# Patient Record
Sex: Male | Born: 1940
Health system: Southern US, Community
[De-identification: ages and names within clinical notes are randomized; demographics above are authoritative.]

## PROBLEM LIST (undated history)

## (undated) DIAGNOSIS — L719 Rosacea, unspecified: Secondary | ICD-10-CM

## (undated) DIAGNOSIS — N189 Chronic kidney disease, unspecified: Secondary | ICD-10-CM

## (undated) DIAGNOSIS — Z86718 Personal history of other venous thrombosis and embolism: Secondary | ICD-10-CM

## (undated) DIAGNOSIS — K429 Umbilical hernia without obstruction or gangrene: Secondary | ICD-10-CM

## (undated) DIAGNOSIS — I10 Essential (primary) hypertension: Secondary | ICD-10-CM

## (undated) DIAGNOSIS — I739 Peripheral vascular disease, unspecified: Secondary | ICD-10-CM

## (undated) DIAGNOSIS — K219 Gastro-esophageal reflux disease without esophagitis: Secondary | ICD-10-CM

## (undated) DIAGNOSIS — E039 Hypothyroidism, unspecified: Secondary | ICD-10-CM

## (undated) DIAGNOSIS — N200 Calculus of kidney: Secondary | ICD-10-CM

## (undated) DIAGNOSIS — G4733 Obstructive sleep apnea (adult) (pediatric): Secondary | ICD-10-CM

## (undated) DIAGNOSIS — E785 Hyperlipidemia, unspecified: Secondary | ICD-10-CM

## (undated) DIAGNOSIS — M1990 Unspecified osteoarthritis, unspecified site: Secondary | ICD-10-CM

## (undated) DIAGNOSIS — E538 Deficiency of other specified B group vitamins: Secondary | ICD-10-CM

## (undated) DIAGNOSIS — N529 Male erectile dysfunction, unspecified: Secondary | ICD-10-CM

## (undated) DIAGNOSIS — E119 Type 2 diabetes mellitus without complications: Secondary | ICD-10-CM

## (undated) HISTORY — PX: OTHER SURGICAL HISTORY: SHX169

## (undated) HISTORY — DX: Male erectile dysfunction, unspecified: N52.9

## (undated) HISTORY — DX: Obstructive sleep apnea (adult) (pediatric): G47.33

## (undated) HISTORY — DX: Calculus of kidney: N20.0

## (undated) HISTORY — DX: Deficiency of other specified B group vitamins: E53.8

## (undated) HISTORY — DX: Hypothyroidism, unspecified: E03.9

## (undated) HISTORY — DX: Umbilical hernia without obstruction or gangrene: K42.9

## (undated) HISTORY — DX: Hyperlipidemia, unspecified: E78.5

## (undated) HISTORY — PX: HERNIA REPAIR: SHX51

## (undated) HISTORY — DX: Type 2 diabetes mellitus without complications: E11.9

## (undated) HISTORY — PX: APPENDECTOMY: SHX54

## (undated) HISTORY — DX: Rosacea, unspecified: L71.9

## (undated) HISTORY — PX: LITHOTRIPSY: SUR834

---
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
## 2009-07-31 LAB — HM COLONOSCOPY: HM Colonoscopy: NORMAL

## 2010-01-23 LAB — HM DEXA SCAN: HM Dexa Scan: NORMAL

## 2011-06-05 ENCOUNTER — Encounter (HOSPITAL_COMMUNITY): Payer: Self-pay | Admitting: Pharmacy Technician

## 2011-06-19 ENCOUNTER — Encounter (HOSPITAL_COMMUNITY): Payer: Self-pay

## 2011-06-19 ENCOUNTER — Ambulatory Visit (HOSPITAL_COMMUNITY)
Admission: RE | Admit: 2011-06-19 | Discharge: 2011-06-19 | Disposition: A | Discharge status: Home / Self Care | Payer: Medicare Other | Source: Ambulatory Visit | Attending: Orthopedic Surgery | Admitting: Orthopedic Surgery

## 2011-06-19 ENCOUNTER — Encounter (HOSPITAL_COMMUNITY)
Admission: RE | Admit: 2011-06-19 | Discharge: 2011-06-19 | Disposition: A | Discharge status: Home / Self Care | Payer: Medicare Other | Source: Ambulatory Visit | Attending: Orthopedic Surgery | Admitting: Orthopedic Surgery

## 2011-06-19 DIAGNOSIS — Z01818 Encounter for other preprocedural examination: Secondary | ICD-10-CM | POA: Insufficient documentation

## 2011-06-19 DIAGNOSIS — I10 Essential (primary) hypertension: Secondary | ICD-10-CM | POA: Insufficient documentation

## 2011-06-19 DIAGNOSIS — Z01812 Encounter for preprocedural laboratory examination: Secondary | ICD-10-CM | POA: Insufficient documentation

## 2011-06-19 DIAGNOSIS — Z87891 Personal history of nicotine dependence: Secondary | ICD-10-CM | POA: Insufficient documentation

## 2011-06-19 DIAGNOSIS — M5380 Other specified dorsopathies, site unspecified: Secondary | ICD-10-CM | POA: Insufficient documentation

## 2011-06-19 HISTORY — DX: Unspecified osteoarthritis, unspecified site: M19.90

## 2011-06-19 HISTORY — DX: Chronic kidney disease, unspecified: N18.9

## 2011-06-19 HISTORY — DX: Gastro-esophageal reflux disease without esophagitis: K21.9

## 2011-06-19 HISTORY — DX: Essential (primary) hypertension: I10

## 2011-06-19 LAB — URINALYSIS, ROUTINE W REFLEX MICROSCOPIC
Bilirubin Urine: NEGATIVE
Leukocytes, UA: NEGATIVE
Nitrite: NEGATIVE
Specific Gravity, Urine: 1.008 (ref 1.005–1.030)
Urobilinogen, UA: 0.2 mg/dL (ref 0.0–1.0)
pH: 7.5 (ref 5.0–8.0)

## 2011-06-19 LAB — DIFFERENTIAL
Basophils Absolute: 0 10*3/uL (ref 0.0–0.1)
Basophils Relative: 0 % (ref 0–1)
Eosinophils Absolute: 0.1 10*3/uL (ref 0.0–0.7)
Eosinophils Relative: 2 % (ref 0–5)
Lymphocytes Relative: 37 % (ref 12–46)
Lymphs Abs: 2 10*3/uL (ref 0.7–4.0)
Monocytes Absolute: 0.5 10*3/uL (ref 0.1–1.0)
Monocytes Relative: 9 % (ref 3–12)
Neutro Abs: 2.8 10*3/uL (ref 1.7–7.7)
Neutrophils Relative %: 52 % (ref 43–77)

## 2011-06-19 LAB — BASIC METABOLIC PANEL
BUN: 14 mg/dL (ref 6–23)
CO2: 28 mEq/L (ref 19–32)
Calcium: 9.7 mg/dL (ref 8.4–10.5)
Chloride: 102 mEq/L (ref 96–112)
Creatinine, Ser: 0.98 mg/dL (ref 0.50–1.35)
GFR calc Af Amer: >90 mL/min (ref >90)
GFR calc non Af Amer: 83 mL/min — ABNORMAL LOW (ref >90)
Glucose, Bld: 96 mg/dL (ref 70–99)
Potassium: 3.8 mEq/L (ref 3.5–5.1)
Sodium: 140 mEq/L (ref 135–145)

## 2011-06-19 LAB — CBC
Hemoglobin: 14 g/dL (ref 13.0–17.0)
Platelets: 188 10*3/uL (ref 150–400)
RBC: 4.52 MIL/uL (ref 4.22–5.81)
WBC: 5.3 10*3/uL (ref 4.0–10.5)

## 2011-06-19 LAB — APTT: aPTT: 36 seconds (ref 24–37)

## 2011-06-19 LAB — PROTIME-INR
INR: 0.96 (ref 0.00–1.49)
Prothrombin Time: 13 seconds (ref 11.6–15.2)

## 2011-06-19 LAB — SURGICAL PCR SCREEN
MRSA, PCR: NEGATIVE
Staphylococcus aureus: NEGATIVE

## 2011-06-19 MED ORDER — CEFAZOLIN SODIUM 1-5 GM-% IV SOLN: 1.0000 g | INTRAVENOUS | Status: DC

## 2011-06-19 NOTE — H&P (Signed)
Brett Armstrong is an 71 y.o. male.    Chief Complaint: Right knee OA / pain   HPI: Pt is a 71 y.o. male complaining of bilateral knee pain for over 10  Years, right worse than left. Pain had continually increased since the beginning.  He feels that he has progressively lost both ROM and strength of both knees.  X-rays in the clinic show end-stage arthritic changes including bone-on-bone of bilateral knees. Pt has tried various conservative treatments which have failed to alleviate their symptoms including NSAIDs and injections. Various options are discussed with the patient. If proceeds well he would like to get the other knee done as well in the near future. Risks, benefits and expectations were discussed with the patient. Patient understand the risks, benefits and expectations and wishes to proceed with surgery.   PCP:  Dr. Birdena Jubilee  D/C Plans: Home with HHPT/SNF/Rehab  Post-op Meds: Rx given for Xarelto, ASA, Robaxin, Celebrex, Iron, Colace and MiraLax. Xarelto do to his concerns of his father having a DVT and PE which killed him.  Tranexamic Acid: Not to be given  PMH: Past Medical History  Diagnosis Date  . Hypertension   . Diabetes mellitus     ON PILLS   . Chronic kidney disease     HX OF KIDNEY STONES   . GERD (gastroesophageal reflux disease)   . Arthritis     KNEES, HANDS     PSH: Past Surgical History  Procedure Date  . Lithotripsy     X 4  . Appendectomy   . Hernia repair     UMBILICAL   . Other surgical history     TUMOR REMOVED RIGHT FACE - BENIGN     Social History:  reports that he has quit smoking. He has never used smokeless tobacco. He reports that he does not drink alcohol or use illicit drugs.  Allergies:  No Known Allergies  Medications: 1. Coreg 6.25 mg one PO bid 2. Amlodipine 5 mg one PO daily 3. Triamterene/HCTZ 37.5/25 mg one PO daily 4. Losartan 100 mg one PO daily 5. Prevastatin 40 mg one PO qHS 6. Potassium 20 meq one PO qAM 7.  Omeprazole 20 mg one PO bid 8. Vitamin B12 1000 mcg one PO daily (stopped) 9. Vitamin D 2000 IU one PO dailty (stopped) 10. ASA 325 mg one PO daily (stopped over one week) 11. Fosamax 70 mg one PO q week 12. Metformin 100 mg on PO qHS   Dg Chest 2 View  06/19/2011  *RADIOLOGY REPORT*  Clinical Data: Preoperative evaluation for right knee surgery. Hypertension.  Prior history of smoking.  CHEST - 2 VIEW  Comparison: None.  Findings: Heart and mediastinal contours are within normal limits. The lung fields are clear with no signs of focal infiltrate or congestive failure.  No pleural fluid or significant peribronchial cuffing is seen.  Bony structures demonstrate diffuse osteophytosis of the mid and lower thoracic spine and are otherwise intact.  IMPRESSION: No worrisome focal or acute cardiopulmonary abnormality noted.  Original Report Authenticated By: Bertha Stakes, M.D.    ROS: Review of Systems  Constitutional: Negative.   HENT: Negative.   Eyes: Negative.   Respiratory: Negative.   Cardiovascular: Negative.   Genitourinary: Negative.   Musculoskeletal: Positive for joint pain.  Skin: Negative.   Neurological: Negative.   Endo/Heme/Allergies: Negative.   Psychiatric/Behavioral: Negative.        Physican Exam: Physical Exam  Constitutional: He is oriented to person, place,  and time and well-developed, well-nourished, and in no distress.  HENT:  Head: Normocephalic and atraumatic.  Nose: Nose normal.  Mouth/Throat: Oropharynx is clear and moist.  Eyes: Conjunctivae and EOM are normal. Pupils are equal, round, and reactive to light.  Neck: Neck supple. No JVD present. No tracheal deviation present. No thyromegaly present.  Cardiovascular: Normal rate, regular rhythm and normal heart sounds.   Pulmonary/Chest: Effort normal and breath sounds normal. No stridor. No respiratory distress. He has no wheezes. He has no rales. He exhibits no tenderness.  Abdominal: Bowel sounds  are normal. There is no tenderness. There is no rebound. A hernia is present.    Musculoskeletal:       Right knee: He exhibits decreased range of motion, deformity, abnormal alignment and bony tenderness. He exhibits no swelling, no effusion, no ecchymosis, no laceration and no erythema. tenderness found. Medial joint line and lateral joint line tenderness noted.       Left knee: He exhibits decreased range of motion, deformity, abnormal alignment and bony tenderness. He exhibits no swelling, no effusion, no ecchymosis, no laceration and no erythema. tenderness found. Medial joint line tenderness noted.  Lymphadenopathy:    He has no cervical adenopathy.  Neurological: He is alert and oriented to person, place, and time.  Skin: Skin is warm and dry.  Psychiatric: Affect normal.      Assessment/Plan Assessment: Right knee OA / pain  Plan: Patient will undergo a right total knee arthroplasty  on 06/24/2011. Risks benefits and expectation were discussed with the patient. Patient understand risks, benefits and expectation and wishes to proceed.   Anastasio Auerbach Conna Terada   PAC  06/19/2011, 8:54 PM

## 2011-06-19 NOTE — Pre-Procedure Instructions (Signed)
04/18/12 Called and left message x 2 to office of Dr Birdena Jubilee [facility] ) Phone number [phone] to request ekg done within last year.

## 2011-06-19 NOTE — Patient Instructions (Signed)
22 Brett Armstrong  06/19/2011   Your procedure is scheduled on:  06/24/11 1000am-1130 am   Report to [facility] at 0800 AM.  Call this number if you have problems the morning of surgery: [phone]   Remember:   Do not eat food:After Midnight.  May have clear liquids:until Midnight .  Marland Kitchen  Take these medicines the morning of surgery with A SIP OF WATER:    Do not wear jewelry,   Do not wear lotions, powders, or perfumes.    Do not bring valuables to the hospital.  Contacts, dentures or bridgework may not be worn into surgery.            Discharge time day of discharge will be at approximately 1100 aM    .      Special Instructions: CHG Shower Use Special Wash: 1/2 bottle night before surgery and 1/2 bottle morning of surgery. shower chin to toes with CHG. Wash face and private parts with regular soap.    Please read over the following fact sheets that you were given: MRSA Information, blood transfusion fact sheet, incentive spirometry fact sheet, coughing and deep breathing exercises and leg leg exercises

## 2011-06-24 ENCOUNTER — Encounter (HOSPITAL_COMMUNITY): Payer: Self-pay | Admitting: Certified Registered Nurse Anesthetist

## 2011-06-24 ENCOUNTER — Other Ambulatory Visit: Payer: Self-pay

## 2011-06-24 ENCOUNTER — Encounter (HOSPITAL_COMMUNITY)
Admission: RE | Disposition: A | Discharge status: Home Health | Payer: Self-pay | Source: Ambulatory Visit | Attending: Orthopedic Surgery

## 2011-06-24 ENCOUNTER — Inpatient Hospital Stay (HOSPITAL_COMMUNITY)
Admission: RE | Admit: 2011-06-24 | Discharge: 2011-06-26 | DRG: 470 | Disposition: A | Discharge status: Home Health | Payer: Medicare Other | Source: Ambulatory Visit | Attending: Orthopedic Surgery | Admitting: Orthopedic Surgery

## 2011-06-24 ENCOUNTER — Encounter (HOSPITAL_COMMUNITY): Payer: Self-pay | Admitting: *Deleted

## 2011-06-24 ENCOUNTER — Inpatient Hospital Stay (HOSPITAL_COMMUNITY): Payer: Medicare Other | Admitting: Certified Registered Nurse Anesthetist

## 2011-06-24 DIAGNOSIS — Z96659 Presence of unspecified artificial knee joint: Secondary | ICD-10-CM

## 2011-06-24 DIAGNOSIS — M1710 Unilateral primary osteoarthritis, unspecified knee: Principal | ICD-10-CM | POA: Diagnosis present

## 2011-06-24 DIAGNOSIS — D649 Anemia, unspecified: Secondary | ICD-10-CM | POA: Diagnosis not present

## 2011-06-24 DIAGNOSIS — K219 Gastro-esophageal reflux disease without esophagitis: Secondary | ICD-10-CM | POA: Diagnosis present

## 2011-06-24 DIAGNOSIS — I129 Hypertensive chronic kidney disease with stage 1 through stage 4 chronic kidney disease, or unspecified chronic kidney disease: Secondary | ICD-10-CM | POA: Diagnosis present

## 2011-06-24 DIAGNOSIS — N189 Chronic kidney disease, unspecified: Secondary | ICD-10-CM | POA: Diagnosis present

## 2011-06-24 DIAGNOSIS — E119 Type 2 diabetes mellitus without complications: Secondary | ICD-10-CM | POA: Diagnosis present

## 2011-06-24 HISTORY — PX: TOTAL KNEE ARTHROPLASTY: SHX125

## 2011-06-24 LAB — TYPE AND SCREEN

## 2011-06-24 LAB — GLUCOSE, CAPILLARY
Glucose-Capillary: 109 mg/dL — ABNORMAL HIGH (ref 70–99)
Glucose-Capillary: 110 mg/dL — ABNORMAL HIGH (ref 70–99)

## 2011-06-24 SURGERY — ARTHROPLASTY, KNEE, TOTAL
Anesthesia: Spinal | Site: Knee | Laterality: Right | Wound class: Clean

## 2011-06-24 MED ORDER — LIDOCAINE HCL (CARDIAC) 20 MG/ML IV SOLN
INTRAVENOUS | Status: DC | PRN
  Administered 2011-06-24: 70 mg via INTRAVENOUS

## 2011-06-24 MED ORDER — CHLORHEXIDINE GLUCONATE 4 % EX LIQD: 60.0000 mL | Freq: Once | CUTANEOUS | Status: DC

## 2011-06-24 MED ORDER — HYDROMORPHONE HCL PF 1 MG/ML IJ SOLN
0.5000 mg | INTRAMUSCULAR | Status: DC | PRN
  Administered 2011-06-24 – 2011-06-25 (×3): 1 mg via INTRAVENOUS
  Filled 2011-06-24 (×3): qty 1

## 2011-06-24 MED ORDER — 0.9 % SODIUM CHLORIDE (POUR BTL) OPTIME
TOPICAL | Status: DC | PRN
  Administered 2011-06-24: 1000 mL

## 2011-06-24 MED ORDER — BUPIVACAINE IN DEXTROSE 0.75-8.25 % IT SOLN
INTRATHECAL | Status: DC | PRN
  Administered 2011-06-24: 2 mL via INTRATHECAL

## 2011-06-24 MED ORDER — ALUM & MAG HYDROXIDE-SIMETH 200-200-20 MG/5ML PO SUSP: 30.0000 mL | ORAL | Status: DC | PRN

## 2011-06-24 MED ORDER — VITAMIN B-12 1000 MCG PO TABS
1000.0000 ug | ORAL_TABLET | Freq: Every day | ORAL | Status: DC
  Administered 2011-06-24 – 2011-06-26 (×3): 1000 ug via ORAL
  Filled 2011-06-24 (×3): qty 1

## 2011-06-24 MED ORDER — METOCLOPRAMIDE HCL 5 MG/ML IJ SOLN
5.0000 mg | Freq: Three times a day (TID) | INTRAMUSCULAR | Status: DC | PRN
  Filled 2011-06-24: qty 2

## 2011-06-24 MED ORDER — PHENOL 1.4 % MT LIQD: 1.0000 | OROMUCOSAL | Status: DC | PRN

## 2011-06-24 MED ORDER — FERROUS SULFATE 325 (65 FE) MG PO TABS
325.0000 mg | ORAL_TABLET | Freq: Three times a day (TID) | ORAL | Status: DC
  Administered 2011-06-24 – 2011-06-26 (×5): 325 mg via ORAL
  Filled 2011-06-24 (×7): qty 1

## 2011-06-24 MED ORDER — HYDROMORPHONE HCL PF 1 MG/ML IJ SOLN
0.5000 mg | INTRAMUSCULAR | Status: AC | PRN
  Administered 2011-06-24 (×4): 0.5 mg via INTRAVENOUS

## 2011-06-24 MED ORDER — CEFAZOLIN SODIUM-DEXTROSE 2-3 GM-% IV SOLR
2.0000 g | Freq: Once | INTRAVENOUS | Status: AC
  Administered 2011-06-24: 2 g via INTRAVENOUS

## 2011-06-24 MED ORDER — RIVAROXABAN 10 MG PO TABS
10.0000 mg | ORAL_TABLET | Freq: Every day | ORAL | Status: DC
  Administered 2011-06-25 – 2011-06-26 (×2): 10 mg via ORAL
  Filled 2011-06-24 (×2): qty 1

## 2011-06-24 MED ORDER — TRIAMTERENE-HCTZ 37.5-25 MG PO CAPS
1.0000 | ORAL_CAPSULE | Freq: Every day | ORAL | Status: DC
  Administered 2011-06-26: 1 via ORAL
  Filled 2011-06-24 (×2): qty 1

## 2011-06-24 MED ORDER — ONDANSETRON HCL 4 MG/2ML IJ SOLN
INTRAMUSCULAR | Status: DC | PRN
  Administered 2011-06-24: 4 mg via INTRAVENOUS

## 2011-06-24 MED ORDER — DIPHENHYDRAMINE HCL 12.5 MG/5ML PO ELIX: 12.5000 mg | ORAL_SOLUTION | ORAL | Status: DC | PRN

## 2011-06-24 MED ORDER — MENTHOL 3 MG MT LOZG: 1.0000 | LOZENGE | OROMUCOSAL | Status: DC | PRN

## 2011-06-24 MED ORDER — ACETAMINOPHEN 10 MG/ML IV SOLN
INTRAVENOUS | Status: DC | PRN
  Administered 2011-06-24: 1000 mg via INTRAVENOUS

## 2011-06-24 MED ORDER — LOSARTAN POTASSIUM 50 MG PO TABS
100.0000 mg | ORAL_TABLET | Freq: Every day | ORAL | Status: DC
  Administered 2011-06-26: 100 mg via ORAL
  Filled 2011-06-24 (×2): qty 2

## 2011-06-24 MED ORDER — LACTATED RINGERS IV SOLN
INTRAVENOUS | Status: DC
  Administered 2011-06-24: 09:00:00 via INTRAVENOUS

## 2011-06-24 MED ORDER — SIMVASTATIN 20 MG PO TABS
20.0000 mg | ORAL_TABLET | Freq: Every day | ORAL | Status: DC
  Administered 2011-06-24 – 2011-06-25 (×2): 20 mg via ORAL
  Filled 2011-06-24 (×3): qty 1

## 2011-06-24 MED ORDER — SODIUM CHLORIDE 0.9 % IV SOLN
INTRAVENOUS | Status: DC
  Administered 2011-06-24 – 2011-06-25 (×2): via INTRAVENOUS
  Filled 2011-06-24 (×10): qty 1000

## 2011-06-24 MED ORDER — METHOCARBAMOL 100 MG/ML IJ SOLN
500.0000 mg | Freq: Four times a day (QID) | INTRAVENOUS | Status: DC | PRN
  Administered 2011-06-24: 500 mg via INTRAVENOUS
  Filled 2011-06-24 (×3): qty 5

## 2011-06-24 MED ORDER — AMLODIPINE BESYLATE 5 MG PO TABS
5.0000 mg | ORAL_TABLET | Freq: Every day | ORAL | Status: DC
  Administered 2011-06-26: 5 mg via ORAL
  Filled 2011-06-24 (×2): qty 1

## 2011-06-24 MED ORDER — METOCLOPRAMIDE HCL 10 MG PO TABS: 5.0000 mg | ORAL_TABLET | Freq: Three times a day (TID) | ORAL | Status: DC | PRN

## 2011-06-24 MED ORDER — MIDAZOLAM HCL 5 MG/5ML IJ SOLN
INTRAMUSCULAR | Status: DC | PRN
  Administered 2011-06-24: 2 mg via INTRAVENOUS

## 2011-06-24 MED ORDER — ACETAMINOPHEN 325 MG PO TABS: 650.0000 mg | ORAL_TABLET | Freq: Four times a day (QID) | ORAL | Status: DC | PRN

## 2011-06-24 MED ORDER — ACETAMINOPHEN 650 MG RE SUPP: 650.0000 mg | Freq: Four times a day (QID) | RECTAL | Status: DC | PRN

## 2011-06-24 MED ORDER — ASPIRIN EC 325 MG PO TBEC
325.0000 mg | DELAYED_RELEASE_TABLET | Freq: Every day | ORAL | Status: DC
  Administered 2011-06-25 – 2011-06-26 (×2): 325 mg via ORAL
  Filled 2011-06-24 (×2): qty 1

## 2011-06-24 MED ORDER — POTASSIUM CHLORIDE CRYS ER 20 MEQ PO TBCR
20.0000 meq | EXTENDED_RELEASE_TABLET | Freq: Every day | ORAL | Status: DC
  Administered 2011-06-24 – 2011-06-26 (×3): 20 meq via ORAL
  Filled 2011-06-24 (×3): qty 1

## 2011-06-24 MED ORDER — ONDANSETRON HCL 4 MG PO TABS: 4.0000 mg | ORAL_TABLET | Freq: Four times a day (QID) | ORAL | Status: DC | PRN

## 2011-06-24 MED ORDER — CEFAZOLIN SODIUM 1-5 GM-% IV SOLN
1.0000 g | Freq: Four times a day (QID) | INTRAVENOUS | Status: AC
  Administered 2011-06-24 – 2011-06-25 (×3): 1 g via INTRAVENOUS
  Filled 2011-06-24 (×6): qty 50

## 2011-06-24 MED ORDER — ONDANSETRON HCL 4 MG/2ML IJ SOLN
4.0000 mg | Freq: Four times a day (QID) | INTRAMUSCULAR | Status: DC | PRN
  Administered 2011-06-25: 4 mg via INTRAVENOUS
  Filled 2011-06-24: qty 2

## 2011-06-24 MED ORDER — METFORMIN HCL 500 MG PO TABS
1000.0000 mg | ORAL_TABLET | Freq: Every day | ORAL | Status: DC
  Administered 2011-06-25: 1000 mg via ORAL
  Filled 2011-06-24 (×3): qty 2

## 2011-06-24 MED ORDER — BUPIVACAINE-EPINEPHRINE PF 0.25-1:200000 % IJ SOLN
INTRAMUSCULAR | Status: DC | PRN
  Administered 2011-06-24: 50 mL

## 2011-06-24 MED ORDER — PROPOFOL 10 MG/ML IV EMUL
INTRAVENOUS | Status: DC | PRN
  Administered 2011-06-24: 120 ug/kg/min via INTRAVENOUS

## 2011-06-24 MED ORDER — HYDROCODONE-ACETAMINOPHEN 7.5-325 MG PO TABS
1.0000 | ORAL_TABLET | ORAL | Status: DC | PRN
  Administered 2011-06-25 (×2): 2 via ORAL
  Administered 2011-06-25 – 2011-06-26 (×5): 1 via ORAL
  Filled 2011-06-24: qty 2
  Filled 2011-06-24 (×5): qty 1
  Filled 2011-06-24: qty 2

## 2011-06-24 MED ORDER — PANTOPRAZOLE SODIUM 40 MG PO TBEC
40.0000 mg | DELAYED_RELEASE_TABLET | Freq: Every day | ORAL | Status: DC
  Administered 2011-06-25 – 2011-06-26 (×2): 40 mg via ORAL
  Filled 2011-06-24 (×2): qty 1

## 2011-06-24 MED ORDER — KETOROLAC TROMETHAMINE 30 MG/ML IJ SOLN
INTRAMUSCULAR | Status: DC | PRN
  Administered 2011-06-24: 30 mg

## 2011-06-24 MED ORDER — INSULIN ASPART 100 UNIT/ML ~~LOC~~ SOLN
0.0000 [IU] | Freq: Three times a day (TID) | SUBCUTANEOUS | Status: DC
  Administered 2011-06-25 – 2011-06-26 (×2): 2 [IU] via SUBCUTANEOUS
  Filled 2011-06-24: qty 3

## 2011-06-24 MED ORDER — SODIUM CHLORIDE 0.9 % IV SOLN
INTRAVENOUS | Status: DC
  Administered 2011-06-24: 13:00:00 via INTRAVENOUS
  Filled 2011-06-24: qty 1000

## 2011-06-24 MED ORDER — CARVEDILOL 6.25 MG PO TABS
6.2500 mg | ORAL_TABLET | Freq: Two times a day (BID) | ORAL | Status: DC
  Administered 2011-06-24 – 2011-06-26 (×4): 6.25 mg via ORAL
  Filled 2011-06-24 (×5): qty 1

## 2011-06-24 MED ORDER — PHENYLEPHRINE HCL 10 MG/ML IJ SOLN
10.0000 mg | INTRAVENOUS | Status: DC | PRN
  Administered 2011-06-24: 20 ug/min via INTRAVENOUS

## 2011-06-24 MED ORDER — METHOCARBAMOL 500 MG PO TABS
500.0000 mg | ORAL_TABLET | Freq: Four times a day (QID) | ORAL | Status: DC | PRN
  Administered 2011-06-25: 500 mg via ORAL
  Filled 2011-06-24 (×2): qty 1

## 2011-06-24 SURGICAL SUPPLY — 58 items
ADH SKN CLS APL DERMABOND .7 (GAUZE/BANDAGES/DRESSINGS) ×1
BAG SPEC THK2 15X12 ZIP CLS (MISCELLANEOUS) ×1
BAG ZIPLOCK 12X15 (MISCELLANEOUS) ×2 IMPLANT
BANDAGE ELASTIC 6 VELCRO ST LF (GAUZE/BANDAGES/DRESSINGS) ×2 IMPLANT
BANDAGE ESMARK 6X9 LF (GAUZE/BANDAGES/DRESSINGS) ×1 IMPLANT
BLADE SAW SGTL 13.0X1.19X90.0M (BLADE) ×2 IMPLANT
BNDG CMPR 9X6 STRL LF SNTH (GAUZE/BANDAGES/DRESSINGS) ×1
BNDG ESMARK 6X9 LF (GAUZE/BANDAGES/DRESSINGS) ×2
BOWL SMART MIX CTS (DISPOSABLE) ×2 IMPLANT
CEMENT HV SMART SET (Cement) ×2 IMPLANT
CLOTH BEACON ORANGE TIMEOUT ST (SAFETY) ×2 IMPLANT
CUFF TOURN SGL QUICK 34 (TOURNIQUET CUFF) ×2
CUFF TRNQT CYL 34X4X40X1 (TOURNIQUET CUFF) ×1 IMPLANT
DECANTER SPIKE VIAL GLASS SM (MISCELLANEOUS) ×2 IMPLANT
DERMABOND ADVANCED (GAUZE/BANDAGES/DRESSINGS) ×1
DERMABOND ADVANCED .7 DNX12 (GAUZE/BANDAGES/DRESSINGS) ×1 IMPLANT
DRAPE EXTREMITY T 121X128X90 (DRAPE) ×2 IMPLANT
DRAPE POUCH INSTRU U-SHP 10X18 (DRAPES) ×2 IMPLANT
DRAPE U-SHAPE 47X51 STRL (DRAPES) ×2 IMPLANT
DRSG AQUACEL AG ADV 3.5X10 (GAUZE/BANDAGES/DRESSINGS) ×2 IMPLANT
DRSG TEGADERM 4X4.75 (GAUZE/BANDAGES/DRESSINGS) ×2 IMPLANT
DURAPREP 26ML APPLICATOR (WOUND CARE) ×2 IMPLANT
ELECT REM PT RETURN 9FT ADLT (ELECTROSURGICAL) ×2
ELECTRODE REM PT RTRN 9FT ADLT (ELECTROSURGICAL) ×1 IMPLANT
EVACUATOR 1/8 PVC DRAIN (DRAIN) ×2 IMPLANT
FACESHIELD LNG OPTICON STERILE (SAFETY) ×10 IMPLANT
GAUZE SPONGE 2X2 8PLY STRL LF (GAUZE/BANDAGES/DRESSINGS) ×1 IMPLANT
GLOVE BIOGEL PI IND STRL 7.5 (GLOVE) ×1 IMPLANT
GLOVE BIOGEL PI IND STRL 8 (GLOVE) ×1 IMPLANT
GLOVE BIOGEL PI INDICATOR 7.5 (GLOVE) ×1
GLOVE BIOGEL PI INDICATOR 8 (GLOVE) ×1
GLOVE ECLIPSE 8.0 STRL XLNG CF (GLOVE) ×2 IMPLANT
GLOVE ORTHO TXT STRL SZ7.5 (GLOVE) ×4 IMPLANT
GOWN STRL NON-REIN LRG LVL3 (GOWN DISPOSABLE) ×2 IMPLANT
HANDPIECE INTERPULSE COAX TIP (DISPOSABLE) ×2
IMMOBILIZER KNEE 20 (SOFTGOODS) ×2
IMMOBILIZER KNEE 20 THIGH 36 (SOFTGOODS) IMPLANT
KIT BASIN OR (CUSTOM PROCEDURE TRAY) ×2 IMPLANT
MANIFOLD NEPTUNE II (INSTRUMENTS) ×2 IMPLANT
NDL SAFETY ECLIPSE 18X1.5 (NEEDLE) ×1 IMPLANT
NEEDLE HYPO 18GX1.5 SHARP (NEEDLE) ×2
NS IRRIG 1000ML POUR BTL (IV SOLUTION) ×3 IMPLANT
PACK TOTAL JOINT (CUSTOM PROCEDURE TRAY) ×2 IMPLANT
POSITIONER SURGICAL ARM (MISCELLANEOUS) ×2 IMPLANT
SET HNDPC FAN SPRY TIP SCT (DISPOSABLE) ×1 IMPLANT
SET PAD KNEE POSITIONER (MISCELLANEOUS) ×2 IMPLANT
SPONGE GAUZE 2X2 STER 10/PKG (GAUZE/BANDAGES/DRESSINGS) ×1
SPONGE LAP 18X18 X RAY DECT (DISPOSABLE) ×1 IMPLANT
SUCTION FRAZIER 12FR DISP (SUCTIONS) ×2 IMPLANT
SUT MNCRL AB 4-0 PS2 18 (SUTURE) ×2 IMPLANT
SUT VIC AB 1 CT1 36 (SUTURE) ×6 IMPLANT
SUT VIC AB 2-0 CT1 27 (SUTURE) ×6
SUT VIC AB 2-0 CT1 TAPERPNT 27 (SUTURE) ×3 IMPLANT
SYR 50ML LL SCALE MARK (SYRINGE) ×2 IMPLANT
TOWEL OR 17X26 10 PK STRL BLUE (TOWEL DISPOSABLE) ×4 IMPLANT
TRAY FOLEY CATH 14FRSI W/METER (CATHETERS) ×2 IMPLANT
WATER STERILE IRR 1500ML POUR (IV SOLUTION) ×2 IMPLANT
WRAP KNEE MAXI GEL POST OP (GAUZE/BANDAGES/DRESSINGS) ×2 IMPLANT

## 2011-06-24 NOTE — Interval H&P Note (Signed)
History and Physical Interval Note:  06/24/2011 9:30 AM  Brett Armstrong  has presented today for surgery, with the diagnosis of Osteoarthritis of Right Knee  The various methods of treatment have been discussed with the patient and family. After consideration of risks, benefits and other options for treatment, the patient has consented to  Procedure(s): RIGHT TOTAL KNEE ARTHROPLASTY as a surgical intervention .  The patients' history has been reviewed, patient examined, no change in status, stable for surgery.  I have reviewed the patients' chart and labs.  Questions were answered to the patient's satisfaction.     [name]

## 2011-06-24 NOTE — Anesthesia Postprocedure Evaluation (Signed)
  Anesthesia Post-op Note  Patient: Brett Armstrong  Procedure(s) Performed:  TOTAL KNEE ARTHROPLASTY  Patient Location: PACU  Anesthesia Type: Spinal  Level of Consciousness: awake and alert   Airway and Oxygen Therapy: Patient Spontanous Breathing  Post-op Pain: mild  Post-op Assessment: Post-op Vital signs reviewed, Patient's Cardiovascular Status Stable, Respiratory Function Stable, Patent Airway and No signs of Nausea or vomiting  Post-op Vital Signs: stable  Complications: No apparent anesthesia complications. Moving feet.

## 2011-06-24 NOTE — Anesthesia Procedure Notes (Addendum)
Spinal  Patient location during procedure: OR Reason for block: at surgeon's request Staffing Anesthesiologist: Azell Der Preanesthetic Checklist Completed: patient identified, site marked, surgical consent, pre-op evaluation, timeout performed, IV checked, risks and benefits discussed and monitors and equipment checked Spinal Block Patient position: sitting Prep: Betadine Patient monitoring: heart rate, cardiac monitor, continuous pulse ox and blood pressure Approach: midline Location: L3-4 Injection technique: single-shot Needle Needle type: Quincke  Needle gauge: 22 G Additional Notes Tolerated well. Spinal tray checked and within expiration date

## 2011-06-24 NOTE — Anesthesia Preprocedure Evaluation (Signed)
Anesthesia Evaluation  Patient identified by MRN, date of birth, ID band Patient awake    Reviewed: Allergy & Precautions, H&P , NPO status , Patient's Chart, lab work & pertinent test results  Airway Mallampati: II TM Distance: >3 FB Neck ROM: Full    Dental No notable dental hx.    Pulmonary neg pulmonary ROS,  clear to auscultation  Pulmonary exam normal       Cardiovascular hypertension, Pt. on home beta blockers and Pt. on medications neg cardio ROS Regular Normal    Neuro/Psych Negative Neurological ROS  Negative Psych ROS   GI/Hepatic negative GI ROS, Neg liver ROS, GERD-  ,  Endo/Other  Negative Endocrine ROSDiabetes mellitus-, Type 2, Oral Hypoglycemic Agents  Renal/GU negative Renal ROS  Genitourinary negative   Musculoskeletal negative musculoskeletal ROS (+)   Abdominal (+) obese,   Peds negative pediatric ROS (+)  Hematology negative hematology ROS (+)   Anesthesia Other Findings   Reproductive/Obstetrics negative OB ROS                           Anesthesia Physical Anesthesia Plan  ASA: III  Anesthesia Plan: Spinal   Post-op Pain Management:    Induction: Intravenous  Airway Management Planned:   Additional Equipment:   Intra-op Plan:   Post-operative Plan: Extubation in OR  Informed Consent: I have reviewed the patients History and Physical, chart, labs and discussed the procedure including the risks, benefits and alternatives for the proposed anesthesia with the patient or authorized representative who has indicated his/her understanding and acceptance.   Dental advisory given  Plan Discussed with: CRNA  Anesthesia Plan Comments: (Discussed risks/benefits of spinal including headache, backache, failure, bleeding, infection, and nerve damage. Patient consents to spinal. Questions answered. )        Anesthesia Quick Evaluation

## 2011-06-24 NOTE — Op Note (Signed)
NAME:  Brett Armstrong                      MEDICAL RECORD NO.:  [id number]                             FACILITY:  [facility]      PHYSICIAN:  Madlyn Frankel. Charlann Boxer, M.D.  DATE OF BIRTH:  14-Dec-1940      DATE OF PROCEDURE:  06/24/2011                                     OPERATIVE REPORT         PREOPERATIVE DIAGNOSIS:  Right knee osteoarthritis.      POSTOPERATIVE DIAGNOSIS:  Right knee osteoarthritis.      FINDINGS:  The patient was noted to have complete loss of cartilage and   bone-on-bone arthritis with associated osteophytes in all three compartments of   the knee with a significant synovitis and associated effusion.      PROCEDURE:  Right total knee replacement.      COMPONENTS USED:  DePuy rotating platform posterior stabilized knee   system, a size 6 femur, 6 tibia, 10 mm insert, and 41 patellar   button.      SURGEON:  Madlyn Frankel. Charlann Boxer, M.D.      ASSISTANT:  Lanney Gins, PA-C.      ANESTHESIA:  Spinal.      SPECIMENS:  None.      COMPLICATION:  None.      DRAINS:  One Hemovac.  EBL: 150cc      TOURNIQUET TIME:   Total Tourniquet Time Documented: Thigh (Right) - 56 minutes .      The patient was stable to the recovery room.      INDICATION FOR PROCEDURE:  [name] is a 71 y.o. male patient of   mine.  The patient had been seen, evaluated, and treated conservatively in the   office with medication, activity modification, and injections.  The patient had   radiographic changes of bone-on-bone arthritis with endplate sclerosis and osteophytes noted.      The patient failed conservative measures including medication, injections, and activity modification, and at this point was ready for more definitive measures.   Based on the radiographic changes and failed conservative measures, the patient   decided to proceed with total knee replacement.  Risks of infection,   DVT, component failure, need for revision surgery, postop course, and   expectations were all   discussed and reviewed.  Consent was obtained for benefit of pain   relief.      PROCEDURE IN DETAIL:  The patient was brought to the operative theater.   Once adequate anesthesia, preoperative antibiotics, 2 gm of Ancef administered, the patient was positioned supine with the right thigh tourniquet placed.  The  right lower extremity was prepped and draped in sterile fashion.  A time-   out was performed identifying the patient, planned procedure, and   extremity.      The right lower extremity was placed in the [facility] leg holder.  The leg was   exsanguinated, tourniquet elevated to 250 mmHg.  A midline incision was   made followed by median parapatellar arthrotomy.  Following initial   exposure, attention was first directed to the patella.  Precut   measurement was noted to  be 29 mm.  I resected down to 16 mm and used a   41 patellar button to restore patellar height as well as cover the cut   surface.  Lateral patellar facet osteophytes were removed.      The lug holes were drilled and a metal shim was placed to protect the   patella from retractors and saw blades.      At this point, attention was now directed to the femur.  The femoral   canal was opened with a drill, irrigated to try to prevent fat emboli.  An   intramedullary rod was passed at 5 degrees valgus, 12 mm of bone was   resected off the distal femur due to pre-operative flexion contracture and size of femur possibly requiring a size 6 implant  Following this resection, the tibia was   subluxated anteriorly.  Using the extramedullary guide, 10-12 mm of bone was resected off   the proximal lateral tibia.  We confirmed the gap would be   stable medially and laterally with a 10 mm insert as well as confirmed   the cut was perpendicular in the coronal plane, checking with an alignment rod.      Once this was done, I sized the femur to be a size 6 in the anterior-   posterior dimension, chose a standard component based on  medial and   lateral dimension.  The size 6 rotation block was then pinned in   position anterior referenced using the C-clamp to set rotation.  The   anterior, posterior, and  chamfer cuts were made without difficulty nor   notching making certain that I was along the anterior cortex to help   with flexion gap stability.      The final box cut was made off the lateral aspect of distal femur.      At this point, the tibia was sized to be a size 6, the size 6 tray was   then pinned in position through the medial third of the tubercle,   drilled, and keel punched.    After the standard cuts, time was spent debriding significant amount of osteophytes Off the posterior medial and lateral femur as well as the medial tibia in order to insure a  Stable and balanced knee.  Trial reduction was now carried with a 6 femur,  6 tibia, a 10 mm insert, and the 41 patella botton.  The knee was brought to   extension, full extension with good flexion stability with the patella   tracking through the trochlea without application of pressure.  Given   all these findings, the trial components removed.  Final components were   opened and cement was mixed.  The knee was irrigated with normal saline   solution and pulse lavage.  The synovial lining was   then injected with 0.25% Marcaine with epinephrine and 1 cc of Toradol,   total of 61 cc.      The knee was irrigated.  Final implants were then cemented onto clean and   dried cut surfaces of bone with the knee brought to extension with a 10   mm trial insert.      Once the cement had fully cured, the excess cement was removed   throughout the knee.  I confirmed I was satisfied with the range of   motion and stability, and the final 10 mm insert was chosen.  It was   placed into the knee.  The tourniquet had been let down at 54 minutes.  No significant   hemostasis required.  The medium Hemovac drain was placed deep.  The   extensor mechanism was  then reapproximated using #1 Vicryl with the knee   in flexion.  The   remaining wound was closed with 2-0 Vicryl and running 4-0 Monocryl.   The knee was cleaned, dried, dressed sterilely using Dermabond and   Aquacel dressing.  Drain site dressed separately.  The patient was then   brought to recovery room in stable condition, tolerating the procedure   well.   Please note that Physician Assistant, Lanney Gins, was present for the entirety of the case, and was utilized for pre-operative positioning, peri-operative retractor management, general facilitation of the procedure.  He was also utilized for primary wound closure at the end of the case.              Madlyn Frankel Charlann Boxer, M.D.

## 2011-06-24 NOTE — Progress Notes (Signed)
Utilization review completed.  

## 2011-06-24 NOTE — Transfer of Care (Signed)
Immediate Anesthesia Transfer of Care Note  Patient: Brett Armstrong  Procedure(s) Performed:  TOTAL KNEE ARTHROPLASTY  Patient Location: PACU  Anesthesia Type: Spinal  Level of Consciousness: sedated, patient cooperative and responds to stimulaton  Airway & Oxygen Therapy: Patient Spontanous Breathing and Patient connected to face mask oxgen  Post-op Assessment: Report given to PACU RN and Post -op Vital signs reviewed and stable  Post vital signs: Reviewed and stable  Complications: No apparent anesthesia complications

## 2011-06-25 DIAGNOSIS — Z96659 Presence of unspecified artificial knee joint: Secondary | ICD-10-CM

## 2011-06-25 LAB — CBC
HCT: 32.5 % — ABNORMAL LOW (ref 39.0–52.0)
Hemoglobin: 10.8 g/dL — ABNORMAL LOW (ref 13.0–17.0)
MCH: 31.2 pg (ref 26.0–34.0)
MCHC: 33.2 g/dL (ref 30.0–36.0)
MCV: 93.9 fL (ref 78.0–100.0)
RBC: 3.46 MIL/uL — ABNORMAL LOW (ref 4.22–5.81)

## 2011-06-25 LAB — BASIC METABOLIC PANEL
BUN: 15 mg/dL (ref 6–23)
CO2: 25 mEq/L (ref 19–32)
Calcium: 8.4 mg/dL (ref 8.4–10.5)
Creatinine, Ser: 0.89 mg/dL (ref 0.50–1.35)
GFR calc non Af Amer: 86 mL/min — ABNORMAL LOW (ref >90)
Glucose, Bld: 129 mg/dL — ABNORMAL HIGH (ref 70–99)

## 2011-06-25 LAB — GLUCOSE, CAPILLARY
Glucose-Capillary: 114 mg/dL — ABNORMAL HIGH (ref 70–99)
Glucose-Capillary: 138 mg/dL — ABNORMAL HIGH (ref 70–99)

## 2011-06-25 MED ORDER — DIPHENHYDRAMINE HCL 25 MG PO TABS: 25.0000 mg | ORAL_TABLET | Freq: Four times a day (QID) | ORAL | Status: AC | PRN

## 2011-06-25 MED ORDER — METHOCARBAMOL 500 MG PO TABS: 500.0000 mg | ORAL_TABLET | Freq: Four times a day (QID) | ORAL | Status: AC | PRN

## 2011-06-25 MED ORDER — DOCUSATE SODIUM 100 MG PO CAPS: 100.0000 mg | ORAL_CAPSULE | Freq: Two times a day (BID) | ORAL | Status: AC

## 2011-06-25 MED ORDER — METFORMIN HCL 500 MG PO TABS
1000.0000 mg | ORAL_TABLET | Freq: Once | ORAL | Status: AC
  Administered 2011-06-25: 1000 mg via ORAL
  Filled 2011-06-25: qty 2

## 2011-06-25 MED ORDER — FERROUS SULFATE 325 (65 FE) MG PO TABS: 325.0000 mg | ORAL_TABLET | Freq: Three times a day (TID) | ORAL | Status: DC

## 2011-06-25 MED ORDER — ASPIRIN EC 325 MG PO TBEC: 325.0000 mg | DELAYED_RELEASE_TABLET | Freq: Two times a day (BID) | ORAL | Status: DC

## 2011-06-25 NOTE — Progress Notes (Signed)
Subjective: 1 Day Post-Op Procedure(s) (LRB): TOTAL KNEE ARTHROPLASTY (Right)   Patient reports pain as mild. No events.  Objective:   VITALS:   Filed Vitals:   06/25/11 0454  BP: 109/64  Pulse: 83  Temp: 98.9 F (37.2 C)  Resp: 16    Neurovascular intact Dorsiflexion/Plantar flexion intact Incision: dressing C/D/I No cellulitis present Compartment soft  LABS  Basename 06/25/11 0415  HGB 10.8*  HCT 32.5*  WBC 8.8  PLT 177     Basename 06/25/11 0415  NA 136  K 4.6  BUN 15  CREATININE 0.89  GLUCOSE 129*     Assessment/Plan: 1 Day Post-Op Procedure(s) (LRB): TOTAL KNEE ARTHROPLASTY (Right)  Foley Cath D/C'ed HV drain D/C'ed Advance diet Up with therapy D/C IV fluids Plan for discharge tomorrow Discharge home with home health   Anastasio Auerbach. Ahnya Akre   PAC  06/25/2011, 8:24 AM

## 2011-06-25 NOTE — Progress Notes (Signed)
Physical Therapy Evaluation Patient Details Name: Brett Armstrong MRN: [id number] DOB: [birth year] Today's Date: 06/25/2011 [id number] Ev2  Discharge Recommendations:  Patient has all needed DME, needs HHPT and 24 hour supervision at d/c (wife will provide)  Problem List:  Patient Active Problem List  Diagnoses  . S/P right total knee replacement    Past Medical History:  Past Medical History  Diagnosis Date  . Hypertension   . Diabetes mellitus     ON PILLS   . Chronic kidney disease     HX OF KIDNEY STONES   . GERD (gastroesophageal reflux disease)   . Arthritis     KNEES, HANDS    Past Surgical History:  Past Surgical History  Procedure Date  . Lithotripsy     X 4  . Appendectomy   . Hernia repair     UMBILICAL   . Other surgical history     TUMOR REMOVED RIGHT FACE - BENIGN     PT Assessment/Plan/Recommendation PT Assessment Clinical Impression Statement: Patient s/p right TKA presents with decreased mobility due to right LE pain, weakness and decreased AROM.  Will benefit from skilled PT in acute setting to maximize independence and allow d/c home with wife assist and HHPT. PT Recommendation/Assessment: Patient will need skilled PT in the acute care venue PT Problem List: Decreased strength;Decreased activity tolerance;Decreased balance;Decreased mobility;Pain;Decreased knowledge of use of DME;Decreased range of motion PT Therapy Diagnosis : Difficulty walking;Generalized weakness;Acute pain PT Plan PT Frequency: 7X/week PT Treatment/Interventions: Stair training;Gait training;DME instruction;Functional mobility training;Therapeutic activities;Therapeutic exercise;Balance training;Patient/family education PT Recommendation Follow Up Recommendations: Home health PT Equipment Recommended: None recommended by PT PT Goals  Acute Rehab PT Goals PT Goal Formulation: With patient/family Time For Goal Achievement: 7 days Pt will go Supine/Side to Sit: with  supervision PT Goal: Supine/Side to Sit - Progress: Not met Pt will go Sit to Supine/Side: with supervision PT Goal: Sit to Supine/Side - Progress: Not met Pt will go Sit to Stand: with supervision PT Goal: Sit to Stand - Progress: Not met Pt will go Stand to Sit: with supervision PT Goal: Stand to Sit - Progress: Not met Pt will Stand: with supervision;with unilateral upper extremity support;with no upper extremity support;1 - 2 min PT Goal: Stand - Progress: Not met Pt will Ambulate: >150 feet;with supervision;with rolling walker PT Goal: Ambulate - Progress: Not met Pt will Go Up / Down Stairs: 3-5 stairs;with least restrictive assistive device;with rail(s) PT Goal: Up/Down Stairs - Progress: Not met Pt will Perform Home Exercise Program: with supervision, verbal cues required/provided PT Goal: Perform Home Exercise Program - Progress: Not met  PT Evaluation Precautions/Restrictions  Precautions Precautions: Knee Precaution Booklet Issued: No Required Braces or Orthoses: Yes Knee Immobilizer: On when out of bed or walking;Discontinue once straight leg raise with < 10 degree lag;On except when in CPM Restrictions Weight Bearing Restrictions: Yes RLE Weight Bearing: Weight bearing as tolerated Prior Functioning  Home Living Lives With: Spouse Type of Home: House Home Layout: One level Home Access: Stairs to enter Entrance Stairs-Rails: Right Entrance Stairs-Number of Steps: 5 Bathroom Shower/Tub: Health visitor: Standard Home Adaptive Equipment: Bedside commode/3-in-1;Crutches;Walker - rolling;Straight cane;Shower chair with back Prior Function Level of Independence: Independent with basic ADLs;Independent with gait;Independent with homemaking with ambulation;Independent with transfers Driving: Yes Vocation: Full time employment Comments: works for [employer] 3 days a week [employer] Arousal/Alertness: Awake/alert Overall Cognitive Status:  Appears within functional limits for tasks assessed Orientation Level: Oriented X4 Sensation/Coordination  Extremity Assessment RLE Assessment RLE Assessment: Exceptions to [facility] RLE AROM (degrees) Overall AROM Right Lower Extremity: Deficits;Due to pain RLE Overall AROM Comments: AAROM approx 45* at knee, extension slightlly limited, ankle WNL RLE Strength RLE Overall Strength: Deficits;Due to pain RLE Overall Strength Comments: positive quad set and knee flexion <3/5 LLE Assessment LLE Assessment: Within Functional Limits (though patient reports needs TKA on left also) Mobility (including Balance) Bed Mobility Bed Mobility: Yes Supine to Sit: 3: Mod assist;HOB elevated (Comment degrees) Supine to Sit Details (indicate cue type and reason): HOB 30*, pt. able to scoot over in bed, assist for right LE, then assist for lifting shoulders to upright Sitting - Scoot to Edge of Bed: 4: Min assist Sitting - Scoot to [place] of Bed Details (indicate cue type and reason): for right LE Transfers Transfers: Yes Sit to Stand: 4: Min assist;From elevated surface;With upper extremity assist;From bed Sit to Stand Details (indicate cue type and reason): cues for technique Stand to Sit: 4: Min assist;With upper extremity assist;To chair/3-in-1 Stand to Sit Details: cues for right leg out and hand placement Ambulation/Gait Ambulation/Gait: Yes Ambulation/Gait Assistance: 4: Min assist Ambulation/Gait Assistance Details (indicate cue type and reason): cues for sequence, walker placement and step length Ambulation Distance (Feet): 15 Feet Assistive device: Rolling walker Gait Pattern: Step-to pattern;Decreased step length - left Gait velocity: slow and patient increasingly light headed so stopped after 15'  Static Standing Balance Static Standing - Balance Support: No upper extremity supported;During functional activity (standing to use urinal) Static Standing - Level of Assistance: 4: Min  assist Static Standing - Comment/# of Minutes: stood approx 1.5 minutes prior to walking Exercise  Total Joint Exercises Ankle Circles/Pumps: AROM;Both;10 reps;Supine Quad Sets: AROM;Right;10 reps;Supine Heel Slides: AAROM;Right;10 reps;Supine End of Session PT - End of Session Equipment Utilized During Treatment: Gait belt;Right knee immobilizer Activity Tolerance: Patient limited by fatigue;Treatment limited secondary to medical complications (Comment) (decreased BP 99/49 sitting after amb, c/o lightheaded + diaphoresis) Patient left: in chair;with call bell in reach;with family/visitor present Nurse Communication: Other (comment) (need to check BP and patient c/o nausea) General Behavior During Session: [facility] for tasks performed Cognition: [facility] for tasks performed  [facility] 06/25/2011, 11:33 AM

## 2011-06-25 NOTE — Discharge Summary (Signed)
Physician Discharge Summary  Patient ID: Brett Armstrong MRN: [id number] DOB/AGE: 1941/02/28 71 y.o.  Admit date: 06/24/2011 Discharge date: 06/26/2011  Procedures:  Procedure(s) (LRB): TOTAL KNEE ARTHROPLASTY (Right)  Attending Physician: Brett Armstrong   Admission Diagnoses: Right knee OA / pain   Discharge Diagnoses:  Principal Problem:  *S/P right total knee replacement Hypertension   Diabetes mellitus   Chronic kidney disease   GERD   Arthritis   HPI: Pt is a 71 y.o. male complaining of bilateral knee pain for over 10 Years, right worse than left. Pain had continually increased since the beginning. He feels that he has progressively lost both ROM and strength of both knees. X-rays in the clinic show end-stage arthritic changes including bone-on-bone of bilateral knees. Pt has tried various conservative treatments which have failed to alleviate their symptoms including NSAIDs and injections. Various options are discussed with the patient. If proceeds well he would like to get the other knee done as well in the near future. Risks, benefits and expectations were discussed with the patient. Patient understand the risks, benefits and expectations and wishes to proceed with surgery.    PCP: Dr. Birdena Armstrong  Discharged Condition: good  Hospital Course:  Patient underwent the above stated procedure on 06/24/2011. Patient tolerated the procedure well and brought to the recovery room in good condition and subsequently to the floor.  POD #1 BP: 109/64 ; Pulse: 83 ; Temp: 98.9 F (37.2 C) ; Resp: 16  Pt's foley was removed, as well as the hemovac drain removed. IV was changed to a saline lock. Patient reports pain as mild. No events. Neurovascular intact, dorsiflexion/plantar flexion intact, incision: dressing C/D/I, no cellulitis present and compartment soft.  LABS  Basename  06/25/11 0415   HGB  10.8*   HCT  32.5*    POD #2  BP: 152/77 ; Pulse: 97 ; Temp: 98.1 F (36.7 C) ; Resp:  17  Patient reports pain as mild. No events. Feels ready to be discharged home with HHPT.  Neurovascular intact, dorsiflexion/plantar flexion intact, incision: dressing C/D/I, no cellulitis present and compartment soft.  LABS  Basename  06/26/11 0405    HGB  9.9*    HCT  29.6*     Discharge Exam: Extremities: Homans sign is negative, no sign of DVT, no edema, redness or tenderness in the calves or thighs and no ulcers, gangrene or trophic changes  Disposition: Final discharge disposition not confirmed  Discharge Orders    Future Orders Please Complete By Expires   Call MD / Call 911      Comments:   If you experience chest pain or shortness of breath, CALL 911 and be transported to the hospital emergency room.  If you develope a fever above 101 F, pus (white drainage) or increased drainage or redness at the wound, or calf pain, call your surgeon's office.   Discharge instructions      Comments:   Maintain surgical dressing for 8 days, then replace with gauze and tape. Keep the area dry and clean until follow up. Follow up in 2 weeks at [facility]. Call with any questions or concerns.     Constipation Prevention      Comments:   Drink plenty of fluids.  Prune juice may be helpful.  You may use a stool softener, such as Colace (over the counter) 100 mg twice a day.  Use MiraLax (over the counter) for constipation as needed.   Increase activity slowly as tolerated  Weight Bearing as taught in Physical Therapy      Comments:   Use a walker or crutches as instructed.   Driving restrictions      Comments:   No driving for 4 weeks   TED hose      Comments:   Use stockings (TED hose) for 2 weeks on both leg(s).  You may remove them at night for sleeping.   Change dressing      Comments:   Maintain surgical dressing for 8 days, then change the dressing daily with sterile 4 x 4 inch gauze dressing and tape. Keep the area dry and clean.      Current Discharge  Medication List    START taking these medications   Details  diphenhydrAMINE (BENADRYL) 25 MG tablet Take 1 tablet (25 mg total) by mouth every 6 (six) hours as needed for itching. Qty: 30 tablet, Refills: 0    docusate sodium (COLACE) 100 MG capsule Take 1 capsule (100 mg total) by mouth 2 (two) times daily. Qty: 10 capsule, Refills: 0    ferrous sulfate 325 (65 FE) MG tablet Take 1 tablet (325 mg total) by mouth 3 (three) times daily after meals.    HYDROcodone-acetaminophen (NORCO) 7.5-325 MG per tablet Take 1-2 tablets by mouth every 4 (four) hours as needed for pain. Qty: 120 tablet, Refills: 0    methocarbamol (ROBAXIN) 500 MG tablet Take 1 tablet (500 mg total) by mouth every 6 (six) hours as needed.    rivaroxaban (XARELTO) 10 MG TABS tablet Take 1 tablet (10 mg total) by mouth daily with breakfast. Qty: 12 tablet, Refills: 0   Comments: Take first. When finished, start the 325 mg aspirin.      CONTINUE these medications which have CHANGED   Details  aspirin EC 325 MG tablet Take 1 tablet (325 mg total) by mouth 2 (two) times daily. Take for 4 weeks  Start after finishing the Xarelto      CONTINUE these medications which have NOT CHANGED   Details  alendronate (FOSAMAX) 70 MG tablet Take 70 mg by mouth every 7 (seven) days. Take with a full glass of water on an empty stomach. Saturday     amLODipine (NORVASC) 5 MG tablet Take 5 mg by mouth daily after breakfast.     carvedilol (COREG) 6.25 MG tablet Take 6.25 mg by mouth 2 (two) times daily with a meal.     Cholecalciferol (VITAMIN D) 2000 UNITS tablet Take 2,000 Units by mouth daily.     losartan (COZAAR) 100 MG tablet Take 100 mg by mouth daily after breakfast.     metFORMIN (GLUCOPHAGE) 1000 MG tablet Take 1,000 mg by mouth at bedtime.     metroNIDAZOLE (METROCREAM) 0.75 % cream Apply 1 application topically 2 (two) times daily as needed. Rosacea    omeprazole (PRILOSEC) 20 MG capsule Take 20 mg by mouth 2  (two) times daily.     potassium chloride SA (K-DUR,KLOR-CON) 20 MEQ tablet Take 20 mEq by mouth daily.     pravastatin (PRAVACHOL) 40 MG tablet Take 40 mg by mouth at bedtime.     triamcinolone cream (KENALOG) 0.1 % Apply 1 application topically 2 (two) times daily as needed. Rosacea    triamterene-hydrochlorothiazide (DYAZIDE) 37.5-25 MG per capsule Take 1 capsule by mouth daily after breakfast.     vitamin B-12 (CYANOCOBALAMIN) 1000 MCG tablet Take 1,000 mcg by mouth daily.         Signed:  Anastasio Auerbach. Paddy Neis  PAC  06/26/2011, 8:55 AM

## 2011-06-25 NOTE — Progress Notes (Signed)
Physical Therapy Treatment Patient Details Name: Brett Armstrong MRN: [id number] DOB: 13-Apr-1941 Today's Date: 06/25/2011 1310-1408 2GT, 2TA  PT Assessment/Plan  PT - Assessment/Plan Comments on Treatment Session: Patient tolerated ambulation and increased upright activity this pm still with signs of pallor and slight nausea.  Takes increased time with ambulation and likely increased fall risk.  Advised patient need to increase steady speed to increase safety. PT Plan: Discharge plan remains appropriate PT Frequency: 7X/week Follow Up Recommendations: Home health PT Equipment Recommended: None recommended by PT PT Goals  Acute Rehab PT Goals Pt will go Sit to Supine/Side: with supervision PT Goal: Sit to Supine/Side - Progress: Progressing toward goal Pt will go Sit to Stand: with supervision PT Goal: Sit to Stand - Progress: Progressing toward goal Pt will go Stand to Sit: with supervision PT Goal: Stand to Sit - Progress: Progressing toward goal Pt will Ambulate: >150 feet;with supervision;with rolling walker PT Goal: Ambulate - Progress: Progressing toward goal  PT Treatment Precautions/Restrictions  Precautions Precautions: Knee Precaution Booklet Issued: No Required Braces or Orthoses: Yes Knee Immobilizer: On when out of bed or walking;Discontinue once straight leg raise with < 10 degree lag Restrictions Weight Bearing Restrictions: Yes RLE Weight Bearing: Weight bearing as tolerated Mobility (including Balance) Bed Mobility Sit to Supine: 4: Min assist Sit to Supine - Details (indicate cue type and reason): cues for crossed leg technique, and for positioning in bed Transfers Transfers: Yes Sit to Stand: 4: Min assist;With upper extremity assist;With armrests;From chair/3-in-1 Stand to Sit: 4: Min assist;To chair/3-in-1;To bed;With armrests Stand to Sit Details: cues for technique to sit on 3:1 over toilet and to bed Ambulation/Gait Ambulation/Gait Assistance: 4: Min  assist Ambulation/Gait Assistance Details (indicate cue type and reason): cues for sequence, WBAT, and for steady speed Ambulation Distance (Feet): 150 Feet Assistive device: Rolling walker Gait Pattern: Step-to pattern Gait velocity: slow with trying out increased weight to right leg and for perfect sequence.  Encouraged increased steady speed for decreased fall risk  Static Standing Balance Static Standing - Balance Support: Bilateral upper extremity supported Static Standing - Level of Assistance: 5: Stand by assistance Exercise    End of Session PT - End of Session Equipment Utilized During Treatment: Gait belt;Right knee immobilizer Activity Tolerance: Patient tolerated treatment well Patient left: in bed;with call bell in reach;with family/visitor present General Behavior During Session: [facility] for tasks performed Cognition: [facility] for tasks performed  [facility] 06/25/2011, 3:37 PM

## 2011-06-26 ENCOUNTER — Encounter (HOSPITAL_COMMUNITY): Payer: Self-pay | Admitting: Orthopedic Surgery

## 2011-06-26 LAB — BASIC METABOLIC PANEL
Chloride: 102 mEq/L (ref 96–112)
GFR calc Af Amer: >90 mL/min (ref >90)
GFR calc non Af Amer: 83 mL/min — ABNORMAL LOW (ref >90)
Glucose, Bld: 108 mg/dL — ABNORMAL HIGH (ref 70–99)
Potassium: 3.7 mEq/L (ref 3.5–5.1)
Sodium: 139 mEq/L (ref 135–145)

## 2011-06-26 LAB — CBC
Hemoglobin: 9.9 g/dL — ABNORMAL LOW (ref 13.0–17.0)
MCHC: 33.4 g/dL (ref 30.0–36.0)
WBC: 8.9 10*3/uL (ref 4.0–10.5)

## 2011-06-26 LAB — GLUCOSE, CAPILLARY
Glucose-Capillary: 146 mg/dL — ABNORMAL HIGH (ref 70–99)
Glucose-Capillary: 109 mg/dL — ABNORMAL HIGH (ref 70–99)

## 2011-06-26 MED ORDER — HYDROCODONE-ACETAMINOPHEN 7.5-325 MG PO TABS: 1.0000 | ORAL_TABLET | ORAL | Status: AC | PRN

## 2011-06-26 MED ORDER — FLEET ENEMA 7-19 GM/118ML RE ENEM
1.0000 | ENEMA | Freq: Once | RECTAL | Status: DC
  Filled 2011-06-26: qty 1

## 2011-06-26 MED ORDER — RIVAROXABAN 10 MG PO TABS: 10.0000 mg | ORAL_TABLET | Freq: Every day | ORAL | Status: DC

## 2011-06-26 NOTE — Progress Notes (Signed)
06/26/2011 Raynelle Bring BSN CCM [phone] Pt admitted with osteoarthritis  right knee; total knee replacement done 01/07. CM spoke with spouse regarding discharge palns. Pt is currently working with physical therapist. Spouse states patient will be going home to [facility], [place] where she will be caregiver. She has obtained all of DME except rw was to short. She is calling a friend to get another walker. If walker, [employer] will obtain RW for patient. Spouse will advised [employer] if RW needed. Genevieve Norlander will start hhpt day after discharge to home.

## 2011-06-26 NOTE — Progress Notes (Signed)
Subjective: 2 Days Post-Op Procedure(s) (LRB): TOTAL KNEE ARTHROPLASTY (Right)   Patient reports pain as mild. No events. Feels ready to be discharged home with HHPT.  Objective:   VITALS:   Filed Vitals:   06/26/11 0630  BP: 152/77  Pulse: 97  Temp: 98.1 F (36.7 C)  Resp: 17    Neurovascular intact Dorsiflexion/Plantar flexion intact Incision: dressing C/D/I No cellulitis present Compartment soft  LABS  Basename 06/26/11 0405 06/25/11 0415  HGB 9.9* 10.8*  HCT 29.6* 32.5*  WBC 8.9 8.8  PLT 173 177     Basename 06/26/11 0405 06/25/11 0415  NA 139 136  K 3.7 4.6  BUN 11 15  CREATININE 0.97 0.89  GLUCOSE 108* 129*     Assessment/Plan: 2 Days Post-Op Procedure(s) (LRB): TOTAL KNEE ARTHROPLASTY (Right)   Up with therapy Discharge home with home health today   Anastasio Auerbach. Caley Volkert   PAC  06/26/2011, 8:47 AM

## 2011-06-26 NOTE — Progress Notes (Signed)
Occupational Therapy Evaluation Patient Details Name: Brett Armstrong MRN: [id number] DOB: 05-17-1941 Today's Date: 06/26/2011 10:22-11:05  evII Problem List:  Patient Active Problem List  Diagnoses  . S/P right total knee replacement    Past Medical History:  Past Medical History  Diagnosis Date  . Hypertension   . Diabetes mellitus     ON PILLS   . Chronic kidney disease     HX OF KIDNEY STONES   . GERD (gastroesophageal reflux disease)   . Arthritis     KNEES, HANDS    Past Surgical History:  Past Surgical History  Procedure Date  . Lithotripsy     X 4  . Appendectomy   . Hernia repair     UMBILICAL   . Other surgical history     TUMOR REMOVED RIGHT FACE - BENIGN   . Total knee arthroplasty 06/24/2011    Procedure: TOTAL KNEE ARTHROPLASTY;  Surgeon: [name];  Location: [facility] ORS;  Service: Orthopedics;  Laterality: Right;    OT Assessment/Plan/Recommendation OT Assessment Clinical Impression Statement: 71 year old male admitted for elective  right TKA. Pt currently supervision for toileting and shower transfers, min assist for LB selfcare sit to stand.  Pt overall will have adequate supervision from wife to return home. Currently complaining of dizziness and stomach pain.  BP  was 109/66 in sitting, and doesn't seem related to his issues.  Feel he does not need further OT needs secondary to having all DME and being knowledgeable with it.   OT Recommendation/Assessment: Patient does not need any further OT services Barriers to Discharge: None OT Recommendation Follow Up Recommendations: No OT follow up Equipment Recommended: None recommended by OT Individuals Consulted Consulted and Agree with Results and Recommendations: Patient;Family member/caregiver Family Member Consulted: spouse OT Goals    OT Evaluation Precautions/Restrictions  Precautions Precautions: Knee Precaution Booklet Issued: No Required Braces or Orthoses: Yes Knee Immobilizer: On when out  of bed or walking Restrictions Weight Bearing Restrictions: No RLE Weight Bearing: Weight bearing as tolerated Prior Functioning Home Living Lives With: Spouse Receives Help From: Family Type of Home: House Home Layout: One level Home Access: Stairs to enter Entrance Stairs-Rails: Right Entrance Stairs-Number of Steps: 5 Bathroom Shower/Tub: Health visitor: Standard Home Adaptive Equipment: Bedside commode/3-in-1;Crutches;Walker - rolling;Straight cane;Shower chair with back Prior Function Level of Independence: Independent with basic ADLs;Independent with gait;Independent with homemaking with ambulation;Independent with transfers Driving: Yes Vocation: Part time employment ADL ADL Eating/Feeding: Simulated;Independent Where Assessed - Eating/Feeding: Chair Grooming: Performed;Supervision/safety Where Assessed - Grooming: Standing at sink Upper Body Bathing: Simulated;Set up Where Assessed - Upper Body Bathing: Sitting, chair;Unsupported Lower Body Bathing: Simulated;Minimal assistance Where Assessed - Lower Body Bathing: Sit to stand from chair Upper Body Dressing: Simulated;Set up Where Assessed - Upper Body Dressing: Sit to stand from chair Lower Body Dressing: Simulated;Minimal assistance Where Assessed - Lower Body Dressing: Sit to stand from chair Toilet Transfer: Performed;Supervision/safety Toilet Transfer Method: Proofreader: Raised toilet seat with arms (or 3-in-1 over toilet) Toileting - Clothing Manipulation: Performed;Supervision/safety Where Assessed - Toileting Clothing Manipulation: Sit to stand from 3-in-1 or toilet Toileting - Hygiene: Simulated;Supervision/safety Where Assessed - Toileting Hygiene: Sit to stand from 3-in-1 or toilet Tub/Shower Transfer: Performed;Supervision/safety Tub/Shower Transfer Method: Location manager: Walk in shower Equipment Used: Rolling walker ADL  Comments: Pt with slight decreased ability to reach the right foot for bathing and dressing tasks.  Feel this will improve in a short period since pt  had no issues with this prior to knee surgery.  Note pt with upset stomach and dizziness during session.  Could be due to pain medication and constipation.  Nursing notified. Vision/Perception  Vision - History Baseline Vision: No visual deficits Vision - Assessment Eye Alignment: Within Functional Limits Perception Perception: Within Functional Limits Praxis Praxis: Intact Cognition Cognition Arousal/Alertness: Awake/alert Overall Cognitive Status: Appears within functional limits for tasks assessed Orientation Level: Oriented X4 Sensation/Coordination Sensation Light Touch: Appears Intact Stereognosis: Appears Intact Hot/Cold: Appears Intact Proprioception: Appears Intact Coordination Gross Motor Movements are Fluid and Coordinated: Yes Fine Motor Movements are Fluid and Coordinated: Yes Extremity Assessment RUE Assessment RUE Assessment: Within Functional Limits LUE Assessment LUE Assessment: Within Functional Limits Mobility  Bed Mobility Bed Mobility: No Supine to Sit: 4: Min assist Supine to Sit Details (indicate cue type and reason): for right LE Sitting - Scoot to Edge of Bed: 4: Min assist Sitting - Scoot to [place] of Bed Details (indicate cue type and reason): for right LE Transfers Transfers: Yes Sit to Stand: 5: Supervision;From chair/3-in-1;With upper extremity assist Sit to Stand Details (indicate cue type and reason): held walker and educated wife how to assist Stand to Sit: 5: Supervision;With upper extremity assist;With armrests;To chair/3-in-1 Stand to Sit Details: questioning cues for technique  End of Session OT - End of Session Equipment Utilized During Treatment: Other (comment) Adult nurse) Activity Tolerance: Treatment limited secondary to medical complications (Comment) (Pt with dizziness and  upset stomach) Nurse Communication: Mobility status for transfers General Behavior During Session: [facility] for tasks performed Cognition: [facility] for tasks performed   Lajune Perine OTR/L 06/26/2011, 12:35 PM  Pager number [phone]

## 2011-06-26 NOTE — Progress Notes (Signed)
Physical Therapy Treatment Patient Details Name: Brett Armstrong MRN: [id number] DOB: 03-26-1941 Today's Date: 06/26/2011 0900-1001  PT Assessment/Plan  PT - Assessment/Plan Comments on Treatment Session: Patient and wife verbalized and demo understanding of technique on steps.  May need different pain med due to still slightly nauseated.  RN aware.  Will have daughter assist as well on steps.  RW from home too short, but can borrow from neighbor who has right heigjht walker. PT Plan: Discharge plan remains appropriate PT Frequency: 7X/week Follow Up Recommendations: Home health PT Equipment Recommended: None recommended by PT PT Goals  Acute Rehab PT Goals Pt will go Supine/Side to Sit: with supervision PT Goal: Supine/Side to Sit - Progress: Progressing toward goal Pt will go Sit to Stand: with supervision PT Goal: Sit to Stand - Progress: Met Pt will go Stand to Sit: with supervision PT Goal: Stand to Sit - Progress: Met Pt will Ambulate: >150 feet;with rolling walker;with supervision PT Goal: Ambulate - Progress: Met Pt will Go Up / Down Stairs: 3-5 stairs;with least restrictive assistive device;with rail(s);with min assist PT Goal: Up/Down Stairs - Progress: Met Pt will Perform Home Exercise Program: with supervision, verbal cues required/provided PT Goal: Perform Home Exercise Program - Progress: Progressing toward goal  PT Treatment Precautions/Restrictions  Precautions Precautions: Knee Precaution Booklet Issued: No Required Braces or Orthoses: Yes Knee Immobilizer: On when out of bed or walking Restrictions Weight Bearing Restrictions: No RLE Weight Bearing: Weight bearing as tolerated Mobility (including Balance) Bed Mobility Supine to Sit: 4: Min assist Supine to Sit Details (indicate cue type and reason): for right LE Sitting - Scoot to Edge of Bed: 4: Min assist Sitting - Scoot to [place] of Bed Details (indicate cue type and reason): for right  LE Transfers Transfers: Yes Sit to Stand: 5: Supervision;From elevated surface;With upper extremity assist;From bed Sit to Stand Details (indicate cue type and reason): held walker and educated wife how to assist Stand to Sit: 5: Supervision;With upper extremity assist;With armrests;To chair/3-in-1 Stand to Sit Details: questioning cues for technique Ambulation/Gait Ambulation/Gait: Yes Ambulation/Gait Assistance: 5: Supervision Ambulation/Gait Assistance Details (indicate cue type and reason): cues for walker placement and safety Ambulation Distance (Feet): 250 Feet Assistive device: Rolling walker Gait Pattern: Step-through pattern;Trunk flexed Stairs: Yes Stairs Assistance: 4: Min assist Stairs Assistance Details (indicate cue type and reason): wife present and educated how to assist, performed x1 with PT assist and x1 with wife assist and cues Stair Management Technique: Step to pattern;One rail Right;With crutches Number of Stairs: 4  (x 2 )    Exercise  Total Joint Exercises Ankle Circles/Pumps: AROM;10 reps;Supine;Both Quad Sets: AROM;10 reps;Supine;Right Short Arc QuadBarbaraann Boys;Right;10 reps;Supine;AROM Heel Slides: AAROM;Right;10 reps;Supine Hip ABduction/ADduction: AAROM;Right;10 reps;Supine Straight Leg Raises: AAROM;Right;10 reps;Supine Knee Flexion: AROM;Right;10 reps;Seated End of Session PT - End of Session Equipment Utilized During Treatment: Gait belt;Right knee immobilizer Activity Tolerance: Patient tolerated treatment well Patient left: in chair General Behavior During Session: [facility] for tasks performed Cognition: [facility] for tasks performed  [facility] 06/26/2011, 12:23 PM

## 2011-07-19 ENCOUNTER — Ambulatory Visit: Payer: Medicare Other | Attending: Orthopedic Surgery | Admitting: Physical Therapy

## 2011-07-19 DIAGNOSIS — M25669 Stiffness of unspecified knee, not elsewhere classified: Secondary | ICD-10-CM | POA: Insufficient documentation

## 2011-07-19 DIAGNOSIS — R262 Difficulty in walking, not elsewhere classified: Secondary | ICD-10-CM | POA: Insufficient documentation

## 2011-07-19 DIAGNOSIS — M25569 Pain in unspecified knee: Secondary | ICD-10-CM | POA: Insufficient documentation

## 2011-07-19 DIAGNOSIS — IMO0001 Reserved for inherently not codable concepts without codable children: Secondary | ICD-10-CM | POA: Insufficient documentation

## 2011-07-22 ENCOUNTER — Ambulatory Visit: Payer: Medicare Other | Admitting: Rehabilitation

## 2011-07-24 ENCOUNTER — Ambulatory Visit: Payer: Medicare Other | Admitting: Rehabilitation

## 2011-07-26 ENCOUNTER — Ambulatory Visit: Payer: Medicare Other | Admitting: Rehabilitation

## 2011-07-29 ENCOUNTER — Ambulatory Visit: Payer: Medicare Other | Admitting: Rehabilitation

## 2011-07-31 ENCOUNTER — Ambulatory Visit: Payer: Medicare Other | Admitting: Rehabilitation

## 2011-08-02 ENCOUNTER — Ambulatory Visit: Payer: Medicare Other | Admitting: Rehabilitation

## 2011-08-05 ENCOUNTER — Ambulatory Visit: Payer: Medicare Other | Admitting: Physical Therapy

## 2011-08-07 ENCOUNTER — Ambulatory Visit: Payer: Medicare Other | Admitting: Rehabilitation

## 2011-08-09 ENCOUNTER — Ambulatory Visit: Payer: Medicare Other | Admitting: Rehabilitation

## 2011-08-12 ENCOUNTER — Ambulatory Visit: Payer: Medicare Other | Admitting: Rehabilitation

## 2011-08-14 ENCOUNTER — Ambulatory Visit: Payer: Medicare Other | Admitting: Rehabilitation

## 2011-08-16 ENCOUNTER — Ambulatory Visit: Payer: Medicare Other | Attending: Orthopedic Surgery | Admitting: Physical Therapy

## 2011-08-16 DIAGNOSIS — M25669 Stiffness of unspecified knee, not elsewhere classified: Secondary | ICD-10-CM | POA: Insufficient documentation

## 2011-08-16 DIAGNOSIS — IMO0001 Reserved for inherently not codable concepts without codable children: Secondary | ICD-10-CM | POA: Insufficient documentation

## 2011-08-16 DIAGNOSIS — R262 Difficulty in walking, not elsewhere classified: Secondary | ICD-10-CM | POA: Insufficient documentation

## 2011-08-16 DIAGNOSIS — M25569 Pain in unspecified knee: Secondary | ICD-10-CM | POA: Insufficient documentation

## 2011-08-19 ENCOUNTER — Ambulatory Visit: Payer: Medicare Other | Admitting: Physical Therapy

## 2011-08-21 ENCOUNTER — Encounter: Payer: Medicare Other | Admitting: Rehabilitation

## 2011-08-21 NOTE — Progress Notes (Signed)
H&P performed 08/21/2011 Dictation # [id number]

## 2011-08-22 NOTE — H&P (Signed)
NAMECREW, Brett Armstrong             ACCOUNT NO.:  [id number]  MEDICAL RECORD NO.:  [id number]  LOCATION:                               FACILITY:  [facility]  PHYSICIAN:  Madlyn Frankel. Charlann Boxer, M.D.  DATE OF BIRTH:  1941-06-07  DATE OF ADMISSION:  09/03/2011 DATE OF DISCHARGE:                             HISTORY & PHYSICAL   REFERRING PHYSICIAN:  Birdena Jubilee, MD  DATE OF SURGERY:  September 03, 2011.  ADMITTING DIAGNOSIS:  End-stage osteoarthritis, left knee.  HISTORY OF PRESENT ILLNESS:  This is a 71 year old gentleman with a history of total knee arthroplasty of his right knee in January and has done quite well with that and is very pleased.  At this time, he is now scheduled for total knee arthroplasty of his left knee, which shows end- stage osteoarthritis.  The surgery risk, benefits, and aftercare were discussed in detail with the patient.  Questions invited and answered. Note that the patient's medical doctor is Dr. Dr. Birdena Jubilee in [facility].  The patient is planning on going home after surgery.  He is not a candidate for tranexamic acid or dexamethasone and will not receive those at surgery and he does have a history of his father having a DVT with PE, which caused his demise, so he will be on Xarelto for 10 days after surgery and then on the aspirin.  He is not given his home medications today.  PAST MEDICAL HISTORY:  Drug allergies; no known drug allergies, but some nausea with ROBAXIN.  CURRENT MEDICATIONS: 1. Metformin 1000 mg ER one daily. 2. Fosamax 70 mg one tablet oral q. week. 3. Aspirin 325 mg one oral q. day. 4. Vitamin B12 a 1000 mcg one daily. 5. Omeprazole 20 mg one daily. 6. Potassium chloride 20 mEq one daily. 7. Pravastatin 40 mg one daily. 8. Losartan potassium 100 mg one daily. 9. Triamterene and hydrochlorothiazide 37.5/25 one daily. 10.Amlodipine 5 mg one daily. 11.Carvedilol 6.25 mg one daily.  MEDICAL ILLNESSES:  Include diabetes,  hypertension, hyperlipidemia, and osteoporosis.  PREVIOUS SURGERIES:  Include lithotripsy x4, appendectomy, and herniorrhaphy.  FAMILY HISTORY:  Positive for father with blood clots and pulmonary embolism.  Mother with heart disease.  SOCIAL HISTORY:  The patient is married.  He lives at home.  He does not smoke and does not drink.  Again, he plans on going home after surgery.  REVIEW OF SYSTEMS:  CENTRAL NERVOUS SYSTEM:  Negative for headache, blurred vision, or dizziness.  PULMONARY:  Negative for shortness of breath, PND, or orthopnea.  CARDIOVASCULAR:  Negative for chest pain or palpitation.  GI:  Negative for ulcers or hepatitis.  GU:  Negative for urinary tract difficulty.  MUSCULOSKELETAL:  Positives in HPI.  PHYSICAL EXAMINATION:  GENERAL:  Reveals a well-developed, well- nourished gentleman, in no acute distress. VITAL SIGNS:  Blood pressure 130/87, pulse 86 and regular, respirations 14. HEENT:  Head is normocephalic.  Nose is patent.  Ears are patent. Pupils are equal, round, and reactive to light.  Throat is without injection. NECK:  Supple without adenopathy.  Carotids are 2+ without bruit. CHEST:  Clear to auscultation.  No rales or rhonchi.  Respirations are  14. HEART:  Regular rate and rhythm at 86 beats per minute without murmur. ABDOMEN:  Soft with active bowel sounds.  No masses or organomegaly. NEUROLOGIC:  The patient is alert and oriented to time, place, and person.  Cranial nerves II-XII are grossly intact. EXTREMITIES:  Reveal his right total knee arthroplasty to be doing well. He has full extension and further flexion to 120 degrees.  No redness, swelling, or warmth and the wound is well healed.  No calf tenderness. No cords.  The left knee shows a varus deformity.  He has 3 degrees from full extension with further flexion to 95 degrees.  Sensation and circulation are intact.  IMPRESSION:  End-stage osteoarthritis of the left knee.  PLAN:  Total knee  arthroplasty of the left knee.     Jaquelyn Bitter. Vinton Layson, P.A.   ______________________________ Madlyn Frankel Charlann Boxer, M.D.    SJC/MEDQ  D:  08/21/2011  T:  08/22/2011  Job:  [id number]

## 2011-08-23 ENCOUNTER — Ambulatory Visit: Payer: Medicare Other | Admitting: Physical Therapy

## 2011-08-23 ENCOUNTER — Encounter (HOSPITAL_COMMUNITY): Payer: Self-pay | Admitting: Pharmacy Technician

## 2011-08-26 ENCOUNTER — Ambulatory Visit: Payer: Medicare Other | Admitting: Rehabilitation

## 2011-08-27 ENCOUNTER — Encounter (HOSPITAL_COMMUNITY)
Admission: RE | Admit: 2011-08-27 | Discharge: 2011-08-27 | Disposition: A | Discharge status: Home / Self Care | Payer: Medicare Other | Source: Ambulatory Visit | Attending: Orthopedic Surgery | Admitting: Orthopedic Surgery

## 2011-08-27 ENCOUNTER — Encounter (HOSPITAL_COMMUNITY): Payer: Self-pay

## 2011-08-27 HISTORY — DX: Peripheral vascular disease, unspecified: I73.9

## 2011-08-27 LAB — CBC
HCT: 40 % (ref 39.0–52.0)
Hemoglobin: 13.2 g/dL (ref 13.0–17.0)
MCH: 30.1 pg (ref 26.0–34.0)
MCHC: 33 g/dL (ref 30.0–36.0)
MCV: 91.3 fL (ref 78.0–100.0)
RBC: 4.38 MIL/uL (ref 4.22–5.81)

## 2011-08-27 LAB — URINALYSIS, ROUTINE W REFLEX MICROSCOPIC
Glucose, UA: NEGATIVE mg/dL
Ketones, ur: NEGATIVE mg/dL
Leukocytes, UA: NEGATIVE
Protein, ur: NEGATIVE mg/dL
Urobilinogen, UA: 0.2 mg/dL (ref 0.0–1.0)

## 2011-08-27 LAB — PROTIME-INR: Prothrombin Time: 12.9 seconds (ref 11.6–15.2)

## 2011-08-27 LAB — COMPREHENSIVE METABOLIC PANEL
ALT: 18 U/L (ref 0–53)
BUN: 15 mg/dL (ref 6–23)
CO2: 31 mEq/L (ref 19–32)
Calcium: 9.8 mg/dL (ref 8.4–10.5)
Creatinine, Ser: 1.09 mg/dL (ref 0.50–1.35)
GFR calc Af Amer: 79 mL/min — ABNORMAL LOW (ref >90)
GFR calc non Af Amer: 68 mL/min — ABNORMAL LOW (ref >90)
Glucose, Bld: 90 mg/dL (ref 70–99)
Sodium: 137 mEq/L (ref 135–145)
Total Protein: 7.8 g/dL (ref 6.0–8.3)

## 2011-08-27 LAB — DIFFERENTIAL
Basophils Relative: 1 % (ref 0–1)
Eosinophils Absolute: 0.2 10*3/uL (ref 0.0–0.7)
Eosinophils Relative: 3 % (ref 0–5)
Lymphs Abs: 1.9 10*3/uL (ref 0.7–4.0)
Monocytes Relative: 13 % — ABNORMAL HIGH (ref 3–12)

## 2011-08-27 LAB — SURGICAL PCR SCREEN: MRSA, PCR: NEGATIVE

## 2011-08-27 MED ORDER — CEFAZOLIN SODIUM 1-5 GM-% IV SOLN: 1.0000 g | INTRAVENOUS | Status: DC

## 2011-08-27 MED ORDER — CHLORHEXIDINE GLUCONATE 4 % EX LIQD
60.0000 mL | Freq: Once | CUTANEOUS | Status: DC
  Filled 2011-08-27: qty 60

## 2011-08-27 NOTE — Progress Notes (Signed)
08/27/11 1014  OBSTRUCTIVE SLEEP APNEA  Have you ever been diagnosed with sleep apnea through a sleep study? No  Do you snore loudly (loud enough to be heard through closed doors)?  1  Do you often feel tired, fatigued, or sleepy during the daytime? 0  Has anyone observed you stop breathing during your sleep? 0  Do you have, or are you being treated for high blood pressure? 1  BMI more than 35 kg/m2? 0  Age over 69 years old? 1  Neck circumference greater than 40 cm/18 inches? 0  Gender: 1  Obstructive Sleep Apnea Score 4   Score 4 or greater  Updated health history

## 2011-08-27 NOTE — Pre-Procedure Instructions (Signed)
08/27/11 Fax and confirmation received to Dr Birdena Jubilee ( PCP  ) regarding STOP BANG TOOL Score of 4.  Note also sent to Dr Charlann Boxer , also,

## 2011-08-27 NOTE — Patient Instructions (Signed)
20 Brett Armstrong  08/27/2011   Your procedure is scheduled on:  09/03/11 1030am-1140am  Report to [facility] at 0800 AM.  Call this number if you have problems the morning of surgery: [phone]   Remember:   Do not eat food:After Midnight.  May have clear liquids:until Midnight .   Take these medicines the morning of surgery with A SIP OF WATER:    Do not wear jewelry,   Do not wear lotions, powders, or perfumes   Do not bring valuables to the hospital.  Contacts, dentures or bridgework may not be worn into surgery.  Leave suitcase in the car. After surgery it may be brought to your room.  For patients admitted to the hospital, checkout time is 11:00 AM the day of discharge.     Special Instructions: CHG Shower Use Special Wash: 1/2 bottle night before surgery and 1/2 bottle morning of surgery. shower chin to toes with CHG.  Wash face and private parts with regular soap.     Please read over the following fact sheets that you were given: MRSA Information, coughing and deep breathing exercises, leg exercises, Blood Transfusion Fact Sheet, Incentive Spirometry Fact Sheet.

## 2011-08-28 ENCOUNTER — Ambulatory Visit: Payer: Medicare Other | Admitting: Physical Therapy

## 2011-08-30 ENCOUNTER — Ambulatory Visit: Payer: Medicare Other | Admitting: Rehabilitation

## 2011-09-03 ENCOUNTER — Encounter (HOSPITAL_COMMUNITY): Payer: Self-pay | Admitting: Anesthesiology

## 2011-09-03 ENCOUNTER — Inpatient Hospital Stay (HOSPITAL_COMMUNITY)
Admission: RE | Admit: 2011-09-03 | Discharge: 2011-09-05 | DRG: 470 | Disposition: A | Discharge status: Home Health | Payer: Medicare Other | Source: Ambulatory Visit | Attending: Orthopedic Surgery | Admitting: Orthopedic Surgery

## 2011-09-03 ENCOUNTER — Encounter (HOSPITAL_COMMUNITY)
Admission: RE | Disposition: A | Discharge status: Home Health | Payer: Self-pay | Source: Ambulatory Visit | Attending: Orthopedic Surgery

## 2011-09-03 ENCOUNTER — Encounter (HOSPITAL_COMMUNITY): Payer: Self-pay | Admitting: *Deleted

## 2011-09-03 ENCOUNTER — Ambulatory Visit (HOSPITAL_COMMUNITY): Payer: Medicare Other | Admitting: Anesthesiology

## 2011-09-03 DIAGNOSIS — M1710 Unilateral primary osteoarthritis, unspecified knee: Principal | ICD-10-CM | POA: Diagnosis present

## 2011-09-03 DIAGNOSIS — I10 Essential (primary) hypertension: Secondary | ICD-10-CM | POA: Diagnosis present

## 2011-09-03 DIAGNOSIS — E119 Type 2 diabetes mellitus without complications: Secondary | ICD-10-CM | POA: Diagnosis present

## 2011-09-03 DIAGNOSIS — Z01812 Encounter for preprocedural laboratory examination: Secondary | ICD-10-CM

## 2011-09-03 DIAGNOSIS — Z96659 Presence of unspecified artificial knee joint: Secondary | ICD-10-CM

## 2011-09-03 DIAGNOSIS — E785 Hyperlipidemia, unspecified: Secondary | ICD-10-CM | POA: Diagnosis present

## 2011-09-03 HISTORY — PX: TOTAL KNEE ARTHROPLASTY: SHX125

## 2011-09-03 LAB — GLUCOSE, CAPILLARY: Glucose-Capillary: 108 mg/dL — ABNORMAL HIGH (ref 70–99)

## 2011-09-03 LAB — TYPE AND SCREEN
ABO/RH(D): A POS
Antibody Screen: NEGATIVE

## 2011-09-03 SURGERY — ARTHROPLASTY, KNEE, TOTAL
Anesthesia: Spinal | Site: Knee | Laterality: Left | Wound class: Clean

## 2011-09-03 MED ORDER — METFORMIN HCL 500 MG PO TABS
1000.0000 mg | ORAL_TABLET | Freq: Every day | ORAL | Status: DC
  Filled 2011-09-03: qty 2

## 2011-09-03 MED ORDER — LACTATED RINGERS IV SOLN
INTRAVENOUS | Status: DC
Start: 2011-09-03 — End: 2011-09-03
  Administered 2011-09-03: 12:00:00 via INTRAVENOUS
  Administered 2011-09-03: 1000 mL via INTRAVENOUS

## 2011-09-03 MED ORDER — FERROUS SULFATE 325 (65 FE) MG PO TABS
325.0000 mg | ORAL_TABLET | Freq: Three times a day (TID) | ORAL | Status: DC
  Administered 2011-09-04 – 2011-09-05 (×4): 325 mg via ORAL
  Filled 2011-09-03 (×4): qty 1

## 2011-09-03 MED ORDER — VITAMIN D3 25 MCG (1000 UNIT) PO TABS
2000.0000 [IU] | ORAL_TABLET | Freq: Every day | ORAL | Status: DC
  Filled 2011-09-03 (×3): qty 2

## 2011-09-03 MED ORDER — ONDANSETRON HCL 4 MG/2ML IJ SOLN: 4.0000 mg | Freq: Four times a day (QID) | INTRAMUSCULAR | Status: DC | PRN

## 2011-09-03 MED ORDER — PROPOFOL 10 MG/ML IV EMUL
INTRAVENOUS | Status: DC | PRN
  Administered 2011-09-03: 75 ug/kg/min via INTRAVENOUS

## 2011-09-03 MED ORDER — CHLORHEXIDINE GLUCONATE 4 % EX LIQD: 60.0000 mL | Freq: Once | CUTANEOUS | Status: DC

## 2011-09-03 MED ORDER — ACETAMINOPHEN 10 MG/ML IV SOLN
INTRAVENOUS | Status: AC
  Filled 2011-09-03: qty 100

## 2011-09-03 MED ORDER — VITAMIN B-12 1000 MCG PO TABS
1000.0000 ug | ORAL_TABLET | Freq: Every day | ORAL | Status: DC
  Filled 2011-09-03 (×3): qty 1

## 2011-09-03 MED ORDER — HYDROMORPHONE HCL PF 1 MG/ML IJ SOLN
0.2500 mg | INTRAMUSCULAR | Status: DC | PRN
  Administered 2011-09-03 (×2): 0.25 mg via INTRAVENOUS

## 2011-09-03 MED ORDER — PANTOPRAZOLE SODIUM 40 MG PO TBEC
40.0000 mg | DELAYED_RELEASE_TABLET | Freq: Every day | ORAL | Status: DC
  Administered 2011-09-04: 40 mg via ORAL
  Filled 2011-09-03: qty 1

## 2011-09-03 MED ORDER — KETAMINE HCL 10 MG/ML IJ SOLN
INTRAMUSCULAR | Status: DC | PRN
  Administered 2011-09-03: 20 mg via INTRAVENOUS

## 2011-09-03 MED ORDER — TRIAMTERENE-HCTZ 37.5-25 MG PO CAPS
1.0000 | ORAL_CAPSULE | Freq: Every day | ORAL | Status: DC
  Administered 2011-09-04 – 2011-09-05 (×2): 1 via ORAL
  Filled 2011-09-03 (×2): qty 1

## 2011-09-03 MED ORDER — MENTHOL 3 MG MT LOZG: 1.0000 | LOZENGE | OROMUCOSAL | Status: DC | PRN

## 2011-09-03 MED ORDER — CARVEDILOL 6.25 MG PO TABS
6.2500 mg | ORAL_TABLET | Freq: Two times a day (BID) | ORAL | Status: DC
  Administered 2011-09-03 – 2011-09-05 (×4): 6.25 mg via ORAL
  Filled 2011-09-03 (×5): qty 1

## 2011-09-03 MED ORDER — POTASSIUM CHLORIDE CRYS ER 20 MEQ PO TBCR
20.0000 meq | EXTENDED_RELEASE_TABLET | Freq: Every day | ORAL | Status: DC
  Administered 2011-09-04: 20 meq via ORAL
  Filled 2011-09-03 (×3): qty 1

## 2011-09-03 MED ORDER — SODIUM CHLORIDE 0.9 % IV SOLN
INTRAVENOUS | Status: DC
  Administered 2011-09-03: 22:00:00 via INTRAVENOUS

## 2011-09-03 MED ORDER — KETOROLAC TROMETHAMINE 30 MG/ML IJ SOLN
INTRAMUSCULAR | Status: DC | PRN
  Administered 2011-09-03: 30 mg

## 2011-09-03 MED ORDER — EPHEDRINE SULFATE 50 MG/ML IJ SOLN
INTRAMUSCULAR | Status: DC | PRN
  Administered 2011-09-03 (×3): 5 mg via INTRAVENOUS

## 2011-09-03 MED ORDER — ONDANSETRON HCL 4 MG PO TABS: 4.0000 mg | ORAL_TABLET | Freq: Four times a day (QID) | ORAL | Status: DC | PRN

## 2011-09-03 MED ORDER — POLYETHYLENE GLYCOL 3350 17 G PO PACK: 17.0000 g | PACK | Freq: Every day | ORAL | Status: DC | PRN

## 2011-09-03 MED ORDER — FENTANYL CITRATE 0.05 MG/ML IJ SOLN
INTRAMUSCULAR | Status: DC | PRN
  Administered 2011-09-03: 100 ug via INTRAVENOUS

## 2011-09-03 MED ORDER — BUPIVACAINE-EPINEPHRINE PF 0.25-1:200000 % IJ SOLN
INTRAMUSCULAR | Status: AC
  Filled 2011-09-03: qty 60

## 2011-09-03 MED ORDER — CEFAZOLIN SODIUM-DEXTROSE 2-3 GM-% IV SOLR
2.0000 g | Freq: Once | INTRAVENOUS | Status: AC
  Administered 2011-09-03: 2 g via INTRAVENOUS

## 2011-09-03 MED ORDER — ZOLPIDEM TARTRATE 5 MG PO TABS: 5.0000 mg | ORAL_TABLET | Freq: Every evening | ORAL | Status: DC | PRN

## 2011-09-03 MED ORDER — RIVAROXABAN 10 MG PO TABS
10.0000 mg | ORAL_TABLET | Freq: Every day | ORAL | Status: DC
  Administered 2011-09-04 – 2011-09-05 (×2): 10 mg via ORAL
  Filled 2011-09-03 (×2): qty 1

## 2011-09-03 MED ORDER — HYDROCODONE-ACETAMINOPHEN 7.5-325 MG PO TABS
1.0000 | ORAL_TABLET | ORAL | Status: DC | PRN
  Administered 2011-09-03: 1 via ORAL
  Administered 2011-09-04: 2 via ORAL
  Administered 2011-09-04 (×6): 1 via ORAL
  Administered 2011-09-05 (×2): 2 via ORAL
  Filled 2011-09-03 (×5): qty 1
  Filled 2011-09-03: qty 2
  Filled 2011-09-03: qty 1
  Filled 2011-09-03 (×2): qty 2
  Filled 2011-09-03: qty 1

## 2011-09-03 MED ORDER — KETOROLAC TROMETHAMINE 30 MG/ML IJ SOLN
INTRAMUSCULAR | Status: AC
  Filled 2011-09-03: qty 1

## 2011-09-03 MED ORDER — PHENOL 1.4 % MT LIQD: 1.0000 | OROMUCOSAL | Status: DC | PRN

## 2011-09-03 MED ORDER — PROMETHAZINE HCL 25 MG/ML IJ SOLN: 6.2500 mg | INTRAMUSCULAR | Status: DC | PRN

## 2011-09-03 MED ORDER — CEFAZOLIN SODIUM 1-5 GM-% IV SOLN
1.0000 g | Freq: Four times a day (QID) | INTRAVENOUS | Status: AC
  Administered 2011-09-03 – 2011-09-04 (×3): 1 g via INTRAVENOUS
  Filled 2011-09-03 (×3): qty 50

## 2011-09-03 MED ORDER — INSULIN ASPART 100 UNIT/ML ~~LOC~~ SOLN
0.0000 [IU] | Freq: Three times a day (TID) | SUBCUTANEOUS | Status: DC
  Administered 2011-09-05: 2 [IU] via SUBCUTANEOUS

## 2011-09-03 MED ORDER — ALUMINUM HYDROXIDE GEL 320 MG/5ML PO SUSP
15.0000 mL | ORAL | Status: DC | PRN
  Filled 2011-09-03: qty 30

## 2011-09-03 MED ORDER — CEFAZOLIN SODIUM 1-5 GM-% IV SOLN
INTRAVENOUS | Status: AC
  Filled 2011-09-03: qty 100

## 2011-09-03 MED ORDER — HYDROMORPHONE HCL PF 1 MG/ML IJ SOLN
0.2000 mg | INTRAMUSCULAR | Status: DC | PRN
  Administered 2011-09-03: 0.5 mg via INTRAVENOUS

## 2011-09-03 MED ORDER — LOSARTAN POTASSIUM 50 MG PO TABS
100.0000 mg | ORAL_TABLET | Freq: Every day | ORAL | Status: DC
  Administered 2011-09-04 – 2011-09-05 (×2): 100 mg via ORAL
  Filled 2011-09-03 (×2): qty 2

## 2011-09-03 MED ORDER — HYDROMORPHONE HCL PF 1 MG/ML IJ SOLN
INTRAMUSCULAR | Status: AC
  Filled 2011-09-03: qty 1

## 2011-09-03 MED ORDER — SENNA 8.6 MG PO TABS
1.0000 | ORAL_TABLET | Freq: Two times a day (BID) | ORAL | Status: DC
  Administered 2011-09-03 – 2011-09-04 (×3): 8.6 mg via ORAL
  Filled 2011-09-03 (×3): qty 1

## 2011-09-03 MED ORDER — ACETAMINOPHEN 10 MG/ML IV SOLN
INTRAVENOUS | Status: DC | PRN
  Administered 2011-09-03: 1000 mg via INTRAVENOUS

## 2011-09-03 MED ORDER — AMLODIPINE BESYLATE 5 MG PO TABS
5.0000 mg | ORAL_TABLET | Freq: Every day | ORAL | Status: DC
  Administered 2011-09-04 – 2011-09-05 (×2): 5 mg via ORAL
  Filled 2011-09-03 (×2): qty 1

## 2011-09-03 MED ORDER — DIPHENHYDRAMINE HCL 12.5 MG/5ML PO ELIX: 25.0000 mg | ORAL_SOLUTION | Freq: Four times a day (QID) | ORAL | Status: DC | PRN

## 2011-09-03 MED ORDER — MIDAZOLAM HCL 5 MG/5ML IJ SOLN
INTRAMUSCULAR | Status: DC | PRN
  Administered 2011-09-03: 2 mg via INTRAVENOUS

## 2011-09-03 MED ORDER — TRIAMCINOLONE ACETONIDE 0.1 % EX CREA
1.0000 "application " | TOPICAL_CREAM | Freq: Two times a day (BID) | CUTANEOUS | Status: DC | PRN
  Filled 2011-09-03: qty 15

## 2011-09-03 MED ORDER — SODIUM CHLORIDE 0.9 % IR SOLN
Status: DC | PRN
  Administered 2011-09-03: 3000 mL

## 2011-09-03 MED ORDER — DOCUSATE SODIUM 100 MG PO CAPS
100.0000 mg | ORAL_CAPSULE | Freq: Two times a day (BID) | ORAL | Status: DC
  Administered 2011-09-03 – 2011-09-04 (×3): 100 mg via ORAL
  Filled 2011-09-03 (×3): qty 1

## 2011-09-03 MED ORDER — BUPIVACAINE-EPINEPHRINE PF 0.25-1:200000 % IJ SOLN
INTRAMUSCULAR | Status: DC | PRN
  Administered 2011-09-03: 30 mL

## 2011-09-03 MED ORDER — METRONIDAZOLE 0.75 % EX CREA: 1.0000 "application " | TOPICAL_CREAM | Freq: Two times a day (BID) | CUTANEOUS | Status: DC | PRN

## 2011-09-03 SURGICAL SUPPLY — 60 items
ADH SKN CLS APL DERMABOND .7 (GAUZE/BANDAGES/DRESSINGS) ×1
BAG SPEC THK2 15X12 ZIP CLS (MISCELLANEOUS) ×1
BAG ZIPLOCK 12X15 (MISCELLANEOUS) ×2 IMPLANT
BANDAGE ELASTIC 6 VELCRO ST LF (GAUZE/BANDAGES/DRESSINGS) ×2 IMPLANT
BANDAGE ESMARK 6X9 LF (GAUZE/BANDAGES/DRESSINGS) ×1 IMPLANT
BLADE SAW SGTL 13.0X1.19X90.0M (BLADE) ×2 IMPLANT
BNDG CMPR 9X6 STRL LF SNTH (GAUZE/BANDAGES/DRESSINGS) ×1
BNDG ESMARK 6X9 LF (GAUZE/BANDAGES/DRESSINGS) ×2
BONE CEMENT GENTAMICIN (Cement) ×2 IMPLANT
BOWL SMART MIX CTS (DISPOSABLE) ×2 IMPLANT
CEMENT BONE GENTAMICIN 40 (Cement) IMPLANT
CLOTH BEACON ORANGE TIMEOUT ST (SAFETY) ×2 IMPLANT
CUFF TOURN SGL QUICK 34 (TOURNIQUET CUFF) ×2
CUFF TRNQT CYL 34X4X40X1 (TOURNIQUET CUFF) ×1 IMPLANT
DECANTER SPIKE VIAL GLASS SM (MISCELLANEOUS) ×1 IMPLANT
DERMABOND ADVANCED (GAUZE/BANDAGES/DRESSINGS) ×1
DERMABOND ADVANCED .7 DNX12 (GAUZE/BANDAGES/DRESSINGS) ×1 IMPLANT
DRAPE EXTREMITY T 121X128X90 (DRAPE) ×2 IMPLANT
DRAPE POUCH INSTRU U-SHP 10X18 (DRAPES) ×2 IMPLANT
DRAPE U-SHAPE 47X51 STRL (DRAPES) ×2 IMPLANT
DRSG AQUACEL AG ADV 3.5X10 (GAUZE/BANDAGES/DRESSINGS) ×2 IMPLANT
DRSG TEGADERM 4X4.75 (GAUZE/BANDAGES/DRESSINGS) ×2 IMPLANT
DURAPREP 26ML APPLICATOR (WOUND CARE) ×2 IMPLANT
ELECT REM PT RETURN 9FT ADLT (ELECTROSURGICAL) ×2
ELECTRODE REM PT RTRN 9FT ADLT (ELECTROSURGICAL) ×1 IMPLANT
EVACUATOR 1/8 PVC DRAIN (DRAIN) ×2 IMPLANT
FACESHIELD LNG OPTICON STERILE (SAFETY) ×11 IMPLANT
GAUZE SPONGE 2X2 8PLY STRL LF (GAUZE/BANDAGES/DRESSINGS) ×1 IMPLANT
GLOVE BIOGEL PI IND STRL 7.5 (GLOVE) ×1 IMPLANT
GLOVE BIOGEL PI IND STRL 8 (GLOVE) ×1 IMPLANT
GLOVE BIOGEL PI INDICATOR 7.5 (GLOVE) ×1
GLOVE BIOGEL PI INDICATOR 8 (GLOVE) ×2
GLOVE ECLIPSE 8.0 STRL XLNG CF (GLOVE) ×3 IMPLANT
GLOVE ORTHO TXT STRL SZ7.5 (GLOVE) ×5 IMPLANT
GOWN BRE IMP PREV XXLGXLNG (GOWN DISPOSABLE) ×3 IMPLANT
GOWN STRL NON-REIN LRG LVL3 (GOWN DISPOSABLE) ×4 IMPLANT
HANDPIECE INTERPULSE COAX TIP (DISPOSABLE) ×2
IMMOBILIZER KNEE 20 (SOFTGOODS) ×2
IMMOBILIZER KNEE 20 THIGH 36 (SOFTGOODS) IMPLANT
KIT BASIN OR (CUSTOM PROCEDURE TRAY) ×2 IMPLANT
MANIFOLD NEPTUNE II (INSTRUMENTS) ×2 IMPLANT
NDL SAFETY ECLIPSE 18X1.5 (NEEDLE) ×1 IMPLANT
NEEDLE HYPO 18GX1.5 SHARP (NEEDLE) ×2
NS IRRIG 1000ML POUR BTL (IV SOLUTION) ×4 IMPLANT
PACK TOTAL JOINT (CUSTOM PROCEDURE TRAY) ×2 IMPLANT
POSITIONER SURGICAL ARM (MISCELLANEOUS) ×2 IMPLANT
SET HNDPC FAN SPRY TIP SCT (DISPOSABLE) ×1 IMPLANT
SET PAD KNEE POSITIONER (MISCELLANEOUS) ×2 IMPLANT
SPONGE GAUZE 2X2 STER 10/PKG (GAUZE/BANDAGES/DRESSINGS) ×1
SUCTION FRAZIER 12FR DISP (SUCTIONS) ×2 IMPLANT
SUT MNCRL AB 4-0 PS2 18 (SUTURE) ×2 IMPLANT
SUT VIC AB 1 CT1 36 (SUTURE) ×4 IMPLANT
SUT VIC AB 2-0 CT1 27 (SUTURE) ×6
SUT VIC AB 2-0 CT1 TAPERPNT 27 (SUTURE) ×3 IMPLANT
SUT VLOC 180 0 24IN GS25 (SUTURE) ×1 IMPLANT
SYR 50ML LL SCALE MARK (SYRINGE) ×2 IMPLANT
TOWEL OR 17X26 10 PK STRL BLUE (TOWEL DISPOSABLE) ×4 IMPLANT
TRAY FOLEY CATH 14FRSI W/METER (CATHETERS) ×2 IMPLANT
WATER STERILE IRR 1500ML POUR (IV SOLUTION) ×2 IMPLANT
WRAP KNEE MAXI GEL POST OP (GAUZE/BANDAGES/DRESSINGS) ×2 IMPLANT

## 2011-09-03 NOTE — H&P (View-Only) (Signed)
NAME:  Brett Armstrong, Brett Armstrong             ACCOUNT NO.:  [id number]  MEDICAL RECORD NO.:  [id number]  LOCATION:                               FACILITY:  [facility]  PHYSICIAN:  Matthew D. Olin, M.D.  DATE OF BIRTH:  12/26/1940  DATE OF ADMISSION:  09/03/2011 DATE OF DISCHARGE:                             HISTORY & PHYSICAL   REFERRING PHYSICIAN:  Robyn Zanard, MD  DATE OF SURGERY:  September 03, 2011.  ADMITTING DIAGNOSIS:  End-stage osteoarthritis, left knee.  HISTORY OF PRESENT ILLNESS:  This is a 71-year-old gentleman with a history of total knee arthroplasty of his right knee in January and has done quite well with that and is very pleased.  At this time, he is now scheduled for total knee arthroplasty of his left knee, which shows end- stage osteoarthritis.  The surgery risk, benefits, and aftercare were discussed in detail with the patient.  Questions invited and answered. Note that the patient's medical doctor is Dr. Dr. Robyn Zanard in [facility].  The patient is planning on going home after surgery.  He is not a candidate for tranexamic acid or dexamethasone and will not receive those at surgery and he does have a history of his father having a DVT with PE, which caused his demise, so he will be on Xarelto for 10 days after surgery and then on the aspirin.  He is not given his home medications today.  PAST MEDICAL HISTORY:  Drug allergies; no known drug allergies, but some nausea with ROBAXIN.  CURRENT MEDICATIONS: 1. Metformin 1000 mg ER one daily. 2. Fosamax 70 mg one tablet oral q. week. 3. Aspirin 325 mg one oral q. day. 4. Vitamin B12 a 1000 mcg one daily. 5. Omeprazole 20 mg one daily. 6. Potassium chloride 20 mEq one daily. 7. Pravastatin 40 mg one daily. 8. Losartan potassium 100 mg one daily. 9. Triamterene and hydrochlorothiazide 37.5/25 one daily. 10.Amlodipine 5 mg one daily. 11.Carvedilol 6.25 mg one daily.  MEDICAL ILLNESSES:  Include diabetes,  hypertension, hyperlipidemia, and osteoporosis.  PREVIOUS SURGERIES:  Include lithotripsy x4, appendectomy, and herniorrhaphy.  FAMILY HISTORY:  Positive for father with blood clots and pulmonary embolism.  Mother with heart disease.  SOCIAL HISTORY:  The patient is married.  He lives at home.  He does not smoke and does not drink.  Again, he plans on going home after surgery.  REVIEW OF SYSTEMS:  CENTRAL NERVOUS SYSTEM:  Negative for headache, blurred vision, or dizziness.  PULMONARY:  Negative for shortness of breath, PND, or orthopnea.  CARDIOVASCULAR:  Negative for chest pain or palpitation.  GI:  Negative for ulcers or hepatitis.  GU:  Negative for urinary tract difficulty.  MUSCULOSKELETAL:  Positives in HPI.  PHYSICAL EXAMINATION:  GENERAL:  Reveals a well-developed, well- nourished gentleman, in no acute distress. VITAL SIGNS:  Blood pressure 130/87, pulse 86 and regular, respirations 14. HEENT:  Head is normocephalic.  Nose is patent.  Ears are patent. Pupils are equal, round, and reactive to light.  Throat is without injection. NECK:  Supple without adenopathy.  Carotids are 2+ without bruit. CHEST:  Clear to auscultation.  No rales or rhonchi.  Respirations are   14. HEART:  Regular rate and rhythm at 86 beats per minute without murmur. ABDOMEN:  Soft with active bowel sounds.  No masses or organomegaly. NEUROLOGIC:  The patient is alert and oriented to time, place, and person.  Cranial nerves II-XII are grossly intact. EXTREMITIES:  Reveal his right total knee arthroplasty to be doing well. He has full extension and further flexion to 120 degrees.  No redness, swelling, or warmth and the wound is well healed.  No calf tenderness. No cords.  The left knee shows a varus deformity.  He has 3 degrees from full extension with further flexion to 95 degrees.  Sensation and circulation are intact.  IMPRESSION:  End-stage osteoarthritis of the left knee.  PLAN:  Total knee  arthroplasty of the left knee.     Ellyse Rotolo J. Leetta Hendriks, P.A.   ______________________________ Matthew D. Olin, M.D.    SJC/MEDQ  D:  08/21/2011  T:  08/22/2011  Job:  [id number] 

## 2011-09-03 NOTE — Op Note (Signed)
NAME:  Brett Armstrong                      MEDICAL RECORD NO.:  [id number]                             FACILITY:  [facility]      PHYSICIAN:  Madlyn Frankel. Charlann Boxer, M.D.  DATE OF BIRTH:  [birth year]      DATE OF PROCEDURE:  09/03/2011                                     OPERATIVE REPORT         PREOPERATIVE DIAGNOSIS:  Left knee osteoarthritis.      POSTOPERATIVE DIAGNOSIS:  Left knee osteoarthritis.      FINDINGS:  The patient was noted to have complete loss of cartilage and   bone-on-bone arthritis with associated osteophytes in the medial and patellofemoral compartments of   the knee with a significant synovitis and associated effusion.      PROCEDURE:  Left total knee replacement.      COMPONENTS USED:  DePuy rotating platform posterior stabilized knee   system, a size 6 femur, 5 tibia, 10 mm insert, and 41 patellar   button.      SURGEON:  Madlyn Frankel. Charlann Boxer, M.D.      ASSISTANT:  Leilani Able, PA-C.      ANESTHESIA:  Spinal.      SPECIMENS:  None.      COMPLICATION:  None.      DRAINS:  One Hemovac.  EBL: <100cc      TOURNIQUET TIME:   Total Tourniquet Time Documented: Thigh (Left) - 48 minutes .      The patient was stable to the recovery room.      INDICATION FOR PROCEDURE:  Brett Armstrong is a 71 y.o. male patient of   mine.  The patient had been seen, evaluated, and treated conservatively in the   office with medication, activity modification, and injections.  The patient had   radiographic changes of bone-on-bone arthritis with endplate sclerosis and osteophytes noted.      The patient failed conservative measures including medication, injections, and activity modification, and at this point was ready for more definitive measures.   Based on the radiographic changes and failed conservative measures, the patient   decided to proceed with total knee replacement.  Risks of infection,   DVT, component failure, need for revision surgery, postop course, and   expectations were all   discussed and reviewed.  Consent was obtained for benefit of pain   relief.      PROCEDURE IN DETAIL:  The patient was brought to the operative theater.   Once adequate anesthesia, preoperative antibiotics, 2 gm of Ancef administered, the patient was positioned supine with the left thigh tourniquet placed.  The  left lower extremity was prepped and draped in sterile fashion.  A time-   out was performed identifying the patient, planned procedure, and   extremity.      The left lower extremity was placed in the [facility] leg holder.  The leg was   exsanguinated, tourniquet elevated to 250 mmHg.  A midline incision was   made followed by median parapatellar arthrotomy.  Following initial   exposure, attention was first directed to the patella.  Precut   measurement was  noted to be 27 mm.  I resected down to 14 mm and used a   41 patellar button to restore patellar height as well as cover the cut   surface.      The lug holes were drilled and a metal shim was placed to protect the   patella from retractors and saw blades.      At this point, attention was now directed to the femur.  The femoral   canal was opened with a drill, irrigated to try to prevent fat emboli.  An   intramedullary rod was passed at 5 degrees valgus, 12 mm of bone was   resected off the distal femur due to the femur size and a preoperative flexion contracture.  Following this resection, the tibia was   subluxated anteriorly.  Using the extramedullary guide, 10 mm of bone was resected off   the proximal lateral tibia.  We confirmed the gap would be   stable medially and laterally with a 10 mm insert as well as confirmed   the cut was perpendicular in the coronal plane, checking with an alignment rod.      Once this was done, I sized the femur to be a size 6 in the anterior-   posterior dimension, chose a standard component based on medial and   lateral dimension.  The size 6 rotation block was  then pinned in   position anterior referenced using the C-clamp to set rotation.  The   anterior, posterior, and  chamfer cuts were made without difficulty nor   notching making certain that I was along the anterior cortex to help   with flexion gap stability.      The final box cut was made off the lateral aspect of distal femur.      At this point, the tibia was sized to be a size 5, particularly after removing osteophytes medially, the size 5 tray was   then pinned in position through the medial third of the tubercle,   drilled, and keel punched.  Trial reduction was now carried with a 6 femur,  5 tibia, a 10 mm insert, and the 41 patella botton.  The knee was brought to   extension, full extension with good flexion stability with the patella   tracking through the trochlea without application of pressure.  Given   all these findings, the trial components removed.  Final components were   opened and cement was mixed.  The knee was irrigated with normal saline   solution and pulse lavage.  The synovial lining was   then injected with 0.25% Marcaine with epinephrine and 1 cc of Toradol,   total of 61 cc.      The knee was irrigated.  Final implants were then cemented onto clean and   dried cut surfaces of bone with the knee brought to extension with a 10   mm trial insert.      Once the cement had fully cured, the excess cement was removed   throughout the knee.  I confirmed I was satisfied with the range of   motion and stability, and the final 10 mm insert was chosen.  It was   placed into the knee.      The tourniquet had been let down at 42 minutes.  No significant   hemostasis required.  The medium Hemovac drain was placed deep.  The   extensor mechanism was then reapproximated using #1 Vicryl with the knee  in flexion.  The   remaining wound was closed with 2-0 Vicryl and running 4-0 Monocryl.   The knee was cleaned, dried, dressed sterilely using Dermabond and   Aquacel  dressing.  Drain site dressed separately.  The patient was then   brought to recovery room in stable condition, tolerating the procedure   well.   Please note that Physician Assistant, Leilani Able, was present for the entirety of the case, and was utilized for pre-operative positioning, peri-operative retractor management, general facilitation of the procedure.  He was also utilized for primary wound closure at the end of the case.              Madlyn Frankel Charlann Boxer, M.D.

## 2011-09-03 NOTE — Anesthesia Postprocedure Evaluation (Signed)
  Anesthesia Post-op Note  Patient: Brett Armstrong  Procedure(s) Performed: Procedure(s) (LRB): TOTAL KNEE ARTHROPLASTY (Left)  Patient Location: PACU  Anesthesia Type: Spinal  Level of Consciousness: awake and alert   Airway and Oxygen Therapy: Patient Spontanous Breathing  Post-op Pain: mild  Post-op Assessment: Post-op Vital signs reviewed, Patient's Cardiovascular Status Stable, Respiratory Function Stable, Patent Airway and No signs of Nausea or vomiting  Post-op Vital Signs: stable  Complications: No apparent anesthesia complications

## 2011-09-03 NOTE — Interval H&P Note (Signed)
History and Physical Interval Note:  09/03/2011 6:48 AM  Brett Armstrong  has presented today for surgery, with the diagnosis of left knee osteoarthritis  The various methods of treatment have been discussed with the patient and family. After consideration of risks, benefits and other options for treatment, the patient has consented to  Procedure(s) (LRB): LEFT TOTAL KNEE ARTHROPLASTY (Left) as a surgical intervention .  The patients' history has been reviewed, patient examined, no change in status, stable for surgery.  I have reviewed the patients' chart and labs.  Questions were answered to the patient's satisfaction.     [name]

## 2011-09-03 NOTE — Anesthesia Preprocedure Evaluation (Signed)
Anesthesia Evaluation  Patient identified by MRN, date of birth, ID band Patient awake    Reviewed: Allergy & Precautions, H&P , NPO status , Patient's Chart, lab work & pertinent test results  Airway Mallampati: II TM Distance: >3 FB Neck ROM: Full    Dental No notable dental hx.    Pulmonary neg pulmonary ROS,  breath sounds clear to auscultation  Pulmonary exam normal       Cardiovascular hypertension, Pt. on medications Rhythm:Regular Rate:Normal     Neuro/Psych negative neurological ROS  negative psych ROS   GI/Hepatic Neg liver ROS, GERD-  ,  Endo/Other  Diabetes mellitus-, Type 2  Renal/GU negative Renal ROS  negative genitourinary   Musculoskeletal negative musculoskeletal ROS (+)   Abdominal   Peds negative pediatric ROS (+)  Hematology negative hematology ROS (+)   Anesthesia Other Findings   Reproductive/Obstetrics negative OB ROS                           Anesthesia Physical Anesthesia Plan  ASA: II  Anesthesia Plan: Spinal   Post-op Pain Management:    Induction:   Airway Management Planned: Simple Face Mask  Additional Equipment:   Intra-op Plan:   Post-operative Plan:   Informed Consent: I have reviewed the patients History and Physical, chart, labs and discussed the procedure including the risks, benefits and alternatives for the proposed anesthesia with the patient or authorized representative who has indicated his/her understanding and acceptance.     Plan Discussed with: CRNA  Anesthesia Plan Comments:         Anesthesia Quick Evaluation

## 2011-09-03 NOTE — Anesthesia Procedure Notes (Signed)
Spinal  Patient location during procedure: OR Start time: 09/03/2011 10:12 AM End time: 09/03/2011 10:16 AM Staffing CRNA/Resident: Carmelia Roller R Performed by: resident/CRNA  Preanesthetic Checklist Completed: patient identified, site marked, surgical consent, pre-op evaluation, timeout performed, IV checked, risks and benefits discussed and monitors and equipment checked Spinal Block Patient position: sitting Prep: Betadine Patient monitoring: heart rate, continuous pulse ox and blood pressure Location: L2-3 Injection technique: single-shot Needle Needle type: Sprotte  Needle gauge: 24 G Needle length: 9 cm Needle insertion depth: 6 cm Assessment Sensory level: T6

## 2011-09-03 NOTE — Transfer of Care (Signed)
Immediate Anesthesia Transfer of Care Note  Patient: Brett Armstrong  Procedure(s) Performed: Procedure(s) (LRB): TOTAL KNEE ARTHROPLASTY (Left)  Patient Location: PACU  Anesthesia Type: Regional  Level of Consciousness: sedated  Airway & Oxygen Therapy: Patient Spontanous Breathing and Patient connected to face mask oxygen  Post-op Assessment: Report given to PACU RN and Post -op Vital signs reviewed and stable  Post vital signs: Reviewed and stable  Complications: No apparent anesthesia complications

## 2011-09-04 LAB — BASIC METABOLIC PANEL
BUN: 14 mg/dL (ref 6–23)
CO2: 31 mEq/L (ref 19–32)
Calcium: 8.8 mg/dL (ref 8.4–10.5)
Creatinine, Ser: 0.96 mg/dL (ref 0.50–1.35)

## 2011-09-04 LAB — GLUCOSE, CAPILLARY
Glucose-Capillary: 99 mg/dL (ref 70–99)
Glucose-Capillary: 97 mg/dL (ref 70–99)
Glucose-Capillary: 98 mg/dL (ref 70–99)

## 2011-09-04 LAB — CBC
HCT: 30.2 % — ABNORMAL LOW (ref 39.0–52.0)
MCH: 30.1 pg (ref 26.0–34.0)
MCV: 91.8 fL (ref 78.0–100.0)
Platelets: 171 10*3/uL (ref 150–400)
RBC: 3.29 MIL/uL — ABNORMAL LOW (ref 4.22–5.81)

## 2011-09-04 NOTE — Progress Notes (Signed)
Utilization review completed.  

## 2011-09-04 NOTE — Evaluation (Signed)
Physical Therapy Evaluation Patient Details Name: Brett Armstrong MRN: [id number] DOB: Jul 17, 1940 Today's Date: 09/04/2011  Problem List:  Patient Active Problem List  Diagnoses  . S/P left TKA    Past Medical History:  Past Medical History  Diagnosis Date  . Hypertension   . Diabetes mellitus     ON PILLS   . Chronic kidney disease     HX OF KIDNEY STONES   . GERD (gastroesophageal reflux disease)   . Arthritis     KNEES, HANDS   . Peripheral vascular disease     left leg circulaton problem per pt    Past Surgical History:  Past Surgical History  Procedure Date  . Lithotripsy     X 4  . Appendectomy   . Hernia repair     UMBILICAL   . Other surgical history     TUMOR REMOVED RIGHT FACE - BENIGN   . Total knee arthroplasty 06/24/2011    Procedure: TOTAL KNEE ARTHROPLASTY;  Surgeon: Brett Armstrong;  Location: [facility];  Service: Orthopedics;  Laterality: Right;  . Other surgical history     small facial tumor removal 1970s    PT Assessment/Plan/Recommendation PT Assessment PT Recommendation/Assessment: Patient will need skilled PT in the acute care venue PT Problem List: Decreased strength;Decreased range of motion;Decreased activity tolerance;Decreased mobility;Decreased knowledge of use of DME;Pain PT Therapy Diagnosis : Difficulty walking PT Plan PT Frequency: 7X/week PT Treatment/Interventions: DME instruction;Gait training;Stair training;Functional mobility training;Therapeutic activities;Therapeutic exercise;Patient/family education PT Recommendation Recommendations for Other Services: OT consult Follow Up Recommendations: Home health PT Equipment Recommended: None recommended by PT PT Goals  Acute Rehab PT Goals PT Goal Formulation: With patient Time For Goal Achievement: 7 days Pt will go Supine/Side to Sit: with supervision Pt will go Sit to Supine/Side: with supervision Pt will go Sit to Stand: with supervision Pt will go Stand to Sit: with  supervision Pt will Ambulate: 51 - 150 feet;with supervision;with rolling walker Pt will Go Up / Down Stairs: with min assist;with least restrictive assistive device;3-5 stairs  PT Evaluation Precautions/Restrictions  Precautions Precautions: Knee Required Braces or Orthoses: Yes Knee Immobilizer: Discontinue once straight leg raise with < 10 degree lag Restrictions Weight Bearing Restrictions: No Other Position/Activity Restrictions: WBAT Prior Functioning  Home Living Lives With: Spouse Receives Help From: Family Type of Home: House Home Layout: Multi-level;One level Home Access: Stairs to enter Entrance Stairs-Rails: Left Entrance Stairs-Number of Steps: 4 Home Adaptive Equipment: Walker - rolling Prior Function Level of Independence: Independent with basic ADLs;Independent with gait;Independent with transfers Able to Take Stairs?: Yes Cognition Cognition Arousal/Alertness: Awake/alert Overall Cognitive Status: Appears within functional limits for tasks assessed Orientation Level: Oriented X4 Sensation/Coordination Coordination Gross Motor Movements are Fluid and Coordinated: Yes Extremity Assessment RUE Assessment RUE Assessment: Within Functional Limits LUE Assessment LUE Assessment: Within Functional Limits RLE Assessment RLE Assessment: Exceptions to WFL (AROM -10 - 100 at knee wth 4+/5 strength) LLE Assessment LLE Assessment: Exceptions to [facility] (-10 - 40 AAROM at knee with 2/5 quads) Mobility (including Balance) Bed Mobility Bed Mobility: Yes Supine to Sit: 4: Min assist Transfers Transfers: Yes Sit to Stand: 4: Min assist Stand to Sit: 4: Min assist Ambulation/Gait Ambulation/Gait: Yes Ambulation/Gait Assistance: 4: Min assist Assistive device: Rolling walker Gait Pattern: Step-to pattern    Exercise  Total Joint Exercises Ankle Circles/Pumps: AROM;10 reps;Supine;Both Quad Sets: AROM;Both;Supine;10 reps Heel Slides: AAROM;Supine;Left;10  reps Straight Leg Raises: AAROM;10 reps;Left;Supine End of Session PT - End of Session Equipment Utilized During  Treatment: Left knee immobilizer Activity Tolerance: Patient tolerated treatment well Patient left: in chair;with call bell in reach;with family/visitor present Nurse Communication: Mobility status for transfers;Mobility status for ambulation General Behavior During Session: [facility] for tasks performed Cognition: [facility] for tasks performed  Brett Armstrong 09/04/2011, 1:08 PM

## 2011-09-04 NOTE — Progress Notes (Signed)
Physical Therapy Treatment Patient Details Name: Brett Armstrong MRN: [id number] DOB: 1940-08-01 Today's Date: 09/04/2011  PT Assessment/Plan  PT - Assessment/Plan PT Plan: Discharge plan remains appropriate PT Frequency: 7X/week Recommendations for Other Services: OT consult Follow Up Recommendations: Home health PT Equipment Recommended: None recommended by PT PT Goals  Acute Rehab PT Goals Time For Goal Achievement: 7 days Pt will go Supine/Side to Sit: with supervision PT Goal: Supine/Side to Sit - Progress: Progressing toward goal Pt will go Sit to Supine/Side: with supervision PT Goal: Sit to Supine/Side - Progress: Progressing toward goal Pt will go Sit to Stand: with supervision PT Goal: Sit to Stand - Progress: Progressing toward goal Pt will go Stand to Sit: with supervision PT Goal: Stand to Sit - Progress: Progressing toward goal Pt will Ambulate: 51 - 150 feet;with supervision;with rolling walker PT Goal: Ambulate - Progress: Progressing toward goal  PT Treatment Precautions/Restrictions  Precautions Precautions: Knee Required Braces or Orthoses: Yes Knee Immobilizer: Discontinue once straight leg raise with < 10 degree lag Restrictions Weight Bearing Restrictions: No Other Position/Activity Restrictions: WBAT Mobility (including Balance) Bed Mobility Supine to Sit: 4: Min assist Supine to Sit Details (indicate cue type and reason): min assist with L LE Sit to Supine: 4: Min assist Sit to Supine - Details (indicate cue type and reason): min assist with L LE Transfers Sit to Stand: 4: Min assist Sit to Stand Details (indicate cue type and reason): cues for use for use of UEs Stand to Sit: 4: Min assist Stand to Sit Details: cues for LE management Ambulation/Gait Ambulation/Gait Assistance: 4: Min assist Ambulation/Gait Assistance Details (indicate cue type and reason): min cues for position from RW Ambulation Distance (Feet): 240 Feet Assistive device:  Rolling walker Gait Pattern: Step-to pattern    Exercise    End of Session PT - End of Session Equipment Utilized During Treatment: Left knee immobilizer Activity Tolerance: Patient tolerated treatment well Patient left: in bed;with call bell in reach;with family/visitor present Nurse Communication: Mobility status for transfers;Mobility status for ambulation General Behavior During Session: [facility] for tasks performed Cognition: [facility] for tasks performed  Brett Armstrong 09/04/2011, 4:08 PM

## 2011-09-04 NOTE — Progress Notes (Signed)
CSW consulted to assist with SNF placement. Met with pt this afternoon. Pt plans to return home with [facility] services when ready for d/c. RNCM will assist with d/c planning needs. CSW is available to assist with d/c planning if plan changes and SNF placement is required.  Cori Razor  LCSW  [phone]

## 2011-09-04 NOTE — Progress Notes (Signed)
OT Note Order received, chart reviewed. Spoke briefly with pt and wife. Pt has all necessary DME from previous knee sx and will have prn A from wife. Pt presents with no OT needs at this time. Will sign off.   Garrel Ridgel, OTR/L  Pager [phone] 09/04/2011

## 2011-09-04 NOTE — Progress Notes (Signed)
Subjective: 1 Day Post-Op Procedure(s) (LRB): TOTAL KNEE ARTHROPLASTY (Left)   Patient reports pain as mild. Feels really good, better than after the last TKA. No events throughout the night.   Objective:   VITALS:   Filed Vitals:   09/04/11 0652  BP: 107/61  Pulse: 76  Temp: 98.6 F (37 C)  Resp: 16    Neurovascular intact Dorsiflexion/Plantar flexion intact Incision: dressing C/D/I No cellulitis present Compartment soft  LABS  Basename 09/04/11 0515  HGB 9.9*  HCT 30.2*  WBC 7.7  PLT 171     Basename 09/04/11 0515  NA 137  K 3.8  BUN 14  CREATININE 0.96  GLUCOSE 120*     Assessment/Plan: 1 Day Post-Op Procedure(s) (LRB): TOTAL KNEE ARTHROPLASTY (Left)   Foley cath d/c'ed HV drain d/c'ed Advance diet Up with therapy D/C IV fluids Plan for discharge tomorrow to home, if continues to do well.   Anastasio Auerbach Faduma Cho   PAC  09/04/2011, 9:09 AM

## 2011-09-04 NOTE — Evaluation (Deleted)
Physical Therapy Evaluation Patient Details Name: Brett Armstrong MRN: [id number] DOB: 1940-08-06 Today's Date: 09/04/2011  Problem List:  Patient Active Problem List  Diagnoses  . S/P left TKA    Past Medical History:  Past Medical History  Diagnosis Date  . Hypertension   . Diabetes mellitus     ON PILLS   . Chronic kidney disease     HX OF KIDNEY STONES   . GERD (gastroesophageal reflux disease)   . Arthritis     KNEES, HANDS   . Peripheral vascular disease     left leg circulaton problem per pt    Past Surgical History:  Past Surgical History  Procedure Date  . Lithotripsy     X 4  . Appendectomy   . Hernia repair     UMBILICAL   . Other surgical history     TUMOR REMOVED RIGHT FACE - BENIGN   . Total knee arthroplasty 06/24/2011    Procedure: TOTAL KNEE ARTHROPLASTY;  Surgeon: [name];  Location: [facility];  Service: Orthopedics;  Laterality: Right;  . Other surgical history     small facial tumor removal 1970s    PT Assessment/Plan/Recommendation PT Assessment Clinical Impression Statement: Pt with R THR presents with decreased L LE strength and decreased functional mobility PT Recommendation/Assessment: Patient will need skilled PT in the acute care venue PT Problem List: Decreased strength;Decreased range of motion;Decreased activity tolerance;Decreased mobility;Decreased knowledge of use of DME;Pain PT Therapy Diagnosis : Difficulty walking PT Plan PT Frequency: 7X/week PT Treatment/Interventions: DME instruction;Gait training;Stair training;Functional mobility training;Therapeutic activities;Therapeutic exercise;Patient/family education PT Recommendation Recommendations for Other Services: OT consult Follow Up Recommendations: Home health PT Equipment Recommended: None recommended by PT PT Goals  Acute Rehab PT Goals PT Goal Formulation: With patient Time For Goal Achievement: 7 days Pt will go Supine/Side to Sit: with supervision PT Goal:  Supine/Side to Sit - Progress: Goal set today Pt will go Sit to Supine/Side: with supervision PT Goal: Sit to Supine/Side - Progress: Goal set today Pt will go Sit to Stand: with supervision PT Goal: Sit to Stand - Progress: Goal set today Pt will go Stand to Sit: with supervision PT Goal: Stand to Sit - Progress: Goal set today Pt will Ambulate: 51 - 150 feet;with supervision;with rolling walker PT Goal: Ambulate - Progress: Goal set today Pt will Go Up / Down Stairs: 6-9 stairs;with min assist;with least restrictive assistive device  PT Evaluation Precautions/Restrictions  Precautions Required Braces or Orthoses: No Restrictions Weight Bearing Restrictions: No Other Position/Activity Restrictions: WBAT Prior Functioning  Home Living Lives With: Spouse Receives Help From: Family Type of Home: House Home Layout: Multi-level Alternate Level Stairs-Rails: Right Alternate Level Stairs-Number of Steps: 6 Home Access: Stairs to enter Entrance Stairs-Rails: None Entrance Stairs-Number of Steps: 2 Home Adaptive Equipment: Walker - rolling Prior Function Level of Independence: Independent with basic ADLs;Independent with gait;Independent with transfers Able to Take Stairs?: Yes Cognition Cognition Arousal/Alertness: Awake/alert Overall Cognitive Status: Appears within functional limits for tasks assessed Orientation Level: Oriented X4 Sensation/Coordination Coordination Gross Motor Movements are Fluid and Coordinated: Yes Extremity Assessment RUE Assessment RUE Assessment: Within Functional Limits LUE Assessment LUE Assessment: Within Functional Limits RLE Assessment RLE Assessment: Exceptions to [facility] Barbaraann Boys WFL: 3-/5 hip; 3+/5 quads) LLE Assessment LLE Assessment: Within Functional Limits Mobility (including Balance) Bed Mobility Bed Mobility: Yes Supine to Sit: 3: Mod assist Supine to Sit Details (indicate cue type and reason): cues for sequence and self assist with  UEs Transfers Transfers:  Yes Sit to Stand: 3: Mod assist Sit to Stand Details (indicate cue type and reason): cues for use of UEs and for LE managment Stand to Sit: 4: Min assist Stand to Sit Details: cues for use of UEs and for LE managment Ambulation/Gait Ambulation/Gait: Yes Ambulation/Gait Assistance: 4: Min assist;3: Mod assist Ambulation/Gait Assistance Details (indicate cue type and reason): cues for posture, sequence, and position from RW Ambulation Distance (Feet): 59 Feet Assistive device: 4-wheeled walker Gait Pattern: Step-to pattern    Exercise  Total Joint Exercises Ankle Circles/Pumps: AROM;10 reps;Supine;Both Quad Sets: AROM;Both;Supine;10 reps Heel Slides: AAROM;Supine;15 reps;Right Hip ABduction/ADduction: AAROM;15 reps;Supine;Right End of Session PT - End of Session Activity Tolerance: Patient tolerated treatment well Patient left: in chair;with call bell in reach;with family/visitor present Nurse Communication: Mobility status for transfers;Mobility status for ambulation General Behavior During Session: [facility] for tasks performed Cognition: [facility] for tasks performed  Roslin Norwood 09/04/2011, 12:51 PM

## 2011-09-05 LAB — CBC
HCT: 32.1 % — ABNORMAL LOW (ref 39.0–52.0)
Hemoglobin: 10.5 g/dL — ABNORMAL LOW (ref 13.0–17.0)
MCHC: 32.7 g/dL (ref 30.0–36.0)
RBC: 3.51 MIL/uL — ABNORMAL LOW (ref 4.22–5.81)
WBC: 9.4 10*3/uL (ref 4.0–10.5)

## 2011-09-05 LAB — BASIC METABOLIC PANEL
BUN: 13 mg/dL (ref 6–23)
CO2: 27 mEq/L (ref 19–32)
Chloride: 97 mEq/L (ref 96–112)
Glucose, Bld: 127 mg/dL — ABNORMAL HIGH (ref 70–99)
Potassium: 3.4 mEq/L — ABNORMAL LOW (ref 3.5–5.1)
Sodium: 135 mEq/L (ref 135–145)

## 2011-09-05 LAB — GLUCOSE, CAPILLARY

## 2011-09-05 MED ORDER — ASPIRIN EC 325 MG PO TBEC: 325.0000 mg | DELAYED_RELEASE_TABLET | Freq: Two times a day (BID) | ORAL | Status: DC

## 2011-09-05 MED ORDER — POLYETHYLENE GLYCOL 3350 17 G PO PACK: 17.0000 g | PACK | Freq: Every day | ORAL | Status: AC | PRN

## 2011-09-05 MED ORDER — RIVAROXABAN 10 MG PO TABS: 10.0000 mg | ORAL_TABLET | Freq: Every day | ORAL | Status: DC

## 2011-09-05 MED ORDER — HYDROCODONE-ACETAMINOPHEN 7.5-325 MG PO TABS: 1.0000 | ORAL_TABLET | ORAL | Status: AC | PRN

## 2011-09-05 MED ORDER — FERROUS SULFATE 325 (65 FE) MG PO TABS: 325.0000 mg | ORAL_TABLET | Freq: Three times a day (TID) | ORAL | Status: DC

## 2011-09-05 NOTE — Progress Notes (Signed)
CARE MANAGEMENT NOTE 09/05/2011  Patient:  Brett Armstrong, Brett Armstrong   Account Number:  [id number]  Date Initiated:  09/05/2011  Documentation initiated by:  Colleen Can  Subjective/Objective Assessment:   dx osteoarthroitis knee-left: Total knee replacemnt     Action/Plan:   CM spoke with patient and spouse. Plans are for patient to return to his home in [place], [place] where spouse will be caregiver. Pt already has DME from previous surgery. Offered choice and pt requested [facility] for hh services   Anticipated DC Date:  09/05/2011   Anticipated DC Plan:  HOME W HOME HEALTH SERVICES  In-house referral  Clinical Social Worker      DC Planning Services  CM consult      [facility] Choice  HOME HEALTH   Choice offered to / List presented to:  C-1 Patient   DME arranged  NA      DME agency  NA     HH arranged  HH-2 PT      [facility] agency  [facility]   Status of service:  Completed, signed off Medicare Important Message given?  NA - LOS <3 / Initial given by admissions (If response is "NO", the following Medicare IM given date fields will be blank) Date Medicare IM given:   Date Additional Medicare IM given:    If discussed at Long Length of Stay Meetings, dates discussed:    Comments:  09/05/2011 Brett Armstrong BSN CCM [phone] [facility] services pre-arranged to provide [facility] services prior to hospital admission. Services will start tomorrow 09/06/11. List of HH agencies placed in shadow chart. Pt for discharge today.

## 2011-09-05 NOTE — Progress Notes (Signed)
Physical Therapy Treatment Patient Details Name: Brett Armstrong MRN: [id number] DOB: [birth year] Today's Date: 09/05/2011  PT Assessment/Plan  PT - Assessment/Plan Comments on Treatment Session: Reviewed don/doff KI and car transfers with pt and spouse PT Plan: Discharge plan remains appropriate PT Frequency: 7X/week Recommendations for Other Services: OT consult Follow Up Recommendations: Home health PT Equipment Recommended: None recommended by PT PT Goals  Acute Rehab PT Goals PT Goal Formulation: With patient Time For Goal Achievement: 7 days Pt will go Supine/Side to Sit: with supervision PT Goal: Supine/Side to Sit - Progress: Progressing toward goal Pt will go Sit to Supine/Side: with supervision PT Goal: Sit to Supine/Side - Progress: Progressing toward goal Pt will go Sit to Stand: with supervision PT Goal: Sit to Stand - Progress: Met Pt will go Stand to Sit: with supervision PT Goal: Stand to Sit - Progress: Met Pt will Ambulate: 51 - 150 feet;with supervision;with rolling walker PT Goal: Ambulate - Progress: Met Pt will Go Up / Down Stairs: with min assist;with least restrictive assistive device;3-5 stairs PT Goal: Up/Down Stairs - Progress: Met  PT Treatment Precautions/Restrictions  Precautions Precautions: Knee Required Braces or Orthoses: Yes Knee Immobilizer: Discontinue once straight leg raise with < 10 degree lag Restrictions Weight Bearing Restrictions: No Other Position/Activity Restrictions: WBAT Mobility (including Balance) Bed Mobility Supine to Sit: 4: Min assist Supine to Sit Details (indicate cue type and reason): min assist with L LE Transfers Sit to Stand: 5: Supervision Sit to Stand Details (indicate cue type and reason): cues for use of UEs Stand to Sit: 5: Supervision Ambulation/Gait Ambulation/Gait Assistance: 5: Supervision Ambulation/Gait Assistance Details (indicate cue type and reason): min cues for position from RW Ambulation  Distance (Feet): 150 Feet Assistive device: Rolling walker Gait Pattern: Step-to pattern Stairs: Yes Stairs Assistance: 4: Min assist Stairs Assistance Details (indicate cue type and reason): cues for sequence and for crutch/foot placment Stair Management Technique: One rail Right;Forwards;With crutches Number of Stairs: 4     Exercise  Total Joint Exercises Ankle Circles/Pumps: AROM;20 reps;Supine;Both Quad Sets: AROM;20 reps;Both;Supine Heel Slides: AAROM;Supine;Left;20 reps Straight Leg Raises: AAROM;10 reps;Supine;Left End of Session PT - End of Session Equipment Utilized During Treatment: Left knee immobilizer Activity Tolerance: Patient tolerated treatment well Patient left: in bed;with call bell in reach;with family/visitor present Nurse Communication: Mobility status for transfers;Mobility status for ambulation General Behavior During Session: [facility] for tasks performed Cognition: [facility] for tasks performed  Darleny Sem 09/05/2011, 1:35 PM

## 2011-09-05 NOTE — Progress Notes (Signed)
Subjective: 2 Days Post-Op Procedure(s) (LRB): TOTAL KNEE ARTHROPLASTY (Left)   Patient reports pain as mild. No events. Ready to be discharged home.   Objective:   VITALS:   Filed Vitals:   09/05/11 0450  BP: 113/79  Pulse: 90  Temp: 100.4 F (38 C)  Resp: 18    Neurovascular intact Dorsiflexion/Plantar flexion intact Incision: dressing C/D/I No cellulitis present Compartment soft  LABS  Basename 09/05/11 0436 09/04/11 0515  HGB 10.5* 9.9*  HCT 32.1* 30.2*  WBC 9.4 7.7  PLT 216 171     Basename 09/05/11 0436 09/04/11 0515  NA 135 137  K 3.4* 3.8  BUN 13 14  CREATININE 1.10 0.96  GLUCOSE 127* 120*     Assessment/Plan: 2 Days Post-Op Procedure(s) (LRB): TOTAL KNEE ARTHROPLASTY (Left)   Up with therapy Discharge home with home health Follow up in 2 weeks at [facility].  Follow-up Information    Follow up with OLIN,Denecia Brunette D in 2 weeks.   Contact information:   [facility] [address], [address] [place] [address] [phone]          Anastasio Auerbach. Azariah Latendresse   PAC  09/05/2011, 7:40 AM

## 2011-09-06 NOTE — Discharge Summary (Signed)
Physician Discharge Summary  Patient ID: Brett Armstrong MRN: [id number] DOB/AGE: 1940-12-15 71 y.o.  Admit date: 09/03/2011 Discharge date: 09/05/2011  Procedures:  Procedure(s) (LRB): TOTAL KNEE ARTHROPLASTY (Left)  Attending Physician:  Dr. Durene Romans   Admission Diagnoses: End-stage osteoarthritis, left knee.  Discharge Diagnoses:  Principal Problem:  *S/P left TKA DM HTN Hyperlidemia Osteoporosis  HPI: This is a 71 year old gentleman with a history of total knee arthroplasty of his right knee in January and has done quite well with that and is very pleased. At this time, he is now scheduled for total knee arthroplasty of his left knee, which shows end- stage osteoarthritis. The surgery risk, benefits, and aftercare were discussed in detail with the patient. Questions invited and answered. Note that the patient's medical doctor is Dr. Birdena Jubilee in [facility]. The patient is planning on going home after surgery. He is not a candidate for tranexamic acid or dexamethasone and will not receive those at surgery and he does have a history of his father having a DVT with PE, which caused his demise, so he will be on Xarelto for 10 days after surgery and then on the aspirin. He is not given his home medications today.  PCP: Birdena Jubilee, MD, MD   Discharged Condition: good  Hospital Course:  Patient underwent the above stated procedure on 09/03/2011. Patient tolerated the procedure well and brought to the recovery room in good condition and subsequently to the floor.  POD #1 BP: 107/61 ; Pulse: 76 ; Temp: 98.6 F (37 C) ; Resp: 16  Pt's foley was removed, as well as the hemovac drain removed. IV was changed to a saline lock. Patient reports pain as mild. Feels really good, better than after the last TKA. No events throughout the night.  Neurovascular intact, dorsiflexion/plantar flexion intact, incision: dressing C/D/I, no cellulitis present and compartment soft.    LABS  Basename  09/04/11 0515   HGB  9.9  HCT  30.2    POD #2  BP: 113/79 ; Pulse: 90 ; Temp: 100.4 F (38 C) ; Resp: 18 Patient reports pain as mild. No events. Ready to be discharged home.  Neurovascular intact, dorsiflexion/plantar flexion intact, incision: dressing C/D/I, no cellulitis present and compartment soft.   LABS  Basename  09/05/11 0436   HGB  10.5  HCT  32.1    Discharge Exam: General appearance: alert, cooperative and no distress Extremities: Homans sign is negative, no sign of DVT, no edema, redness or tenderness in the calves or thighs and no ulcers, gangrene or trophic changes  Disposition: Home with follow up in 2 weeks  Follow-up Information    Follow up with OLIN,Jolina Symonds D in 2 weeks.   Contact information:   [facility] [address], [address] [place] [address] [phone]          Discharge Orders    Future Orders Please Complete By Expires   Diet - low sodium heart healthy      Call MD / Call 911      Comments:   If you experience chest pain or shortness of breath, CALL 911 and be transported to the hospital emergency room.  If you develope a fever above 101 F, pus (white drainage) or increased drainage or redness at the wound, or calf pain, call your surgeon's office.   Discharge instructions      Comments:   Maintain surgical dressing for 8 days, then replace with gauze and tape.  Keep the area dry and clean until follow up. Follow up in 2 weeks at [facility]. Call with any questions or concerns.     Constipation Prevention      Comments:   Drink plenty of fluids.  Prune juice may be helpful.  You may use a stool softener, such as Colace (over the counter) 100 mg twice a day.  Use MiraLax (over the counter) for constipation as needed.   Increase activity slowly as tolerated      Weight Bearing as taught in Physical Therapy      Comments:   Use a walker or crutches as instructed.    Driving restrictions      Comments:   No driving for 4 weeks   TED hose      Comments:   Use stockings (TED hose) for 2 weeks on both leg(s).  You may remove them at night for sleeping.   Change dressing      Comments:   Maintain surgical dressing for 8 days, then change the dressing daily with sterile 4 x 4 inch gauze dressing and tape. Keep the area dry and clean.      Discharge Medication List as of 09/05/2011 10:54 AM    START taking these medications   Details  ferrous sulfate 325 (65 FE) MG tablet Take 1 tablet (325 mg total) by mouth 3 (three) times daily after meals., Starting 09/05/2011, Until Fri 09/04/12, No Print    polyethylene glycol (MIRALAX / GLYCOLAX) packet Take 17 g by mouth daily as needed., Starting 09/05/2011, Until Sun 09/08/11, No Print    rivaroxaban (XARELTO) 10 MG TABS tablet Take 1 tablet (10 mg total) by mouth daily with breakfast., Starting 09/05/2011, Until Discontinued, Print      CONTINUE these medications which have CHANGED   Details  aspirin EC 325 MG tablet Take 1 tablet (325 mg total) by mouth 2 (two) times daily., Starting 09/05/2011, Until Discontinued, No Print    HYDROcodone-acetaminophen (NORCO) 7.5-325 MG per tablet Take 1-2 tablets by mouth every 4 (four) hours as needed for pain., Starting 09/05/2011, Until Sun 09/15/11, Print      CONTINUE these medications which have NOT CHANGED   Details  alendronate (FOSAMAX) 70 MG tablet Take 70 mg by mouth every 7 (seven) days. Take with a full glass of water on an empty stomach. Saturday , Until Discontinued, Historical Med    amLODipine (NORVASC) 5 MG tablet Take 5 mg by mouth daily after breakfast. , Until Discontinued, Historical Med    carvedilol (COREG) 6.25 MG tablet Take 6.25 mg by mouth 2 (two) times daily with a meal. , Until Discontinued, Historical Med    Cholecalciferol (VITAMIN D) 2000 UNITS tablet Take 2,000 Units by mouth daily. , Until Discontinued, Historical Med    docusate sodium  (COLACE) 100 MG capsule Take 100 mg by mouth 2 (two) times daily., Until Discontinued, Historical Med    losartan (COZAAR) 100 MG tablet Take 100 mg by mouth daily after breakfast. , Until Discontinued, Historical Med    metroNIDAZOLE (METROCREAM) 0.75 % cream Apply 1 application topically 2 (two) times daily as needed. Rosacea, Until Discontinued, Historical Med    omeprazole (PRILOSEC) 20 MG capsule Take 20 mg by mouth 2 (two) times daily. , Until Discontinued, Historical Med    potassium chloride SA (K-DUR,KLOR-CON) 20 MEQ tablet Take 20 mEq by mouth daily. , Until Discontinued, Historical Med    pravastatin (PRAVACHOL) 40 MG tablet Take 40 mg by mouth  at bedtime. , Until Discontinued, Historical Med    triamterene-hydrochlorothiazide (DYAZIDE) 37.5-25 MG per capsule Take 1 capsule by mouth daily after breakfast. , Until Discontinued, Historical Med    vitamin B-12 (CYANOCOBALAMIN) 1000 MCG tablet Take 1,000 mcg by mouth daily. , Until Discontinued, Historical Med    metFORMIN (GLUCOPHAGE) 1000 MG tablet Take 1,000 mg by mouth at bedtime. , Until Discontinued, Historical Med    triamcinolone cream (KENALOG) 0.1 % Apply 1 application topically 2 (two) times daily as needed. Rosacea, Until Discontinued, Historical Med        Signed: Anastasio Auerbach. Desarae Placide   PAC  09/06/2011, 1:40 PM

## 2011-09-11 ENCOUNTER — Encounter (HOSPITAL_COMMUNITY): Payer: Self-pay | Admitting: Orthopedic Surgery

## 2011-10-01 ENCOUNTER — Ambulatory Visit: Payer: Medicare Other | Attending: Orthopedic Surgery | Admitting: Physical Therapy

## 2011-10-01 DIAGNOSIS — M25569 Pain in unspecified knee: Secondary | ICD-10-CM | POA: Insufficient documentation

## 2011-10-01 DIAGNOSIS — R262 Difficulty in walking, not elsewhere classified: Secondary | ICD-10-CM | POA: Insufficient documentation

## 2011-10-01 DIAGNOSIS — M25669 Stiffness of unspecified knee, not elsewhere classified: Secondary | ICD-10-CM | POA: Insufficient documentation

## 2011-10-01 DIAGNOSIS — Z96659 Presence of unspecified artificial knee joint: Secondary | ICD-10-CM | POA: Insufficient documentation

## 2011-10-01 DIAGNOSIS — IMO0001 Reserved for inherently not codable concepts without codable children: Secondary | ICD-10-CM | POA: Insufficient documentation

## 2011-10-02 ENCOUNTER — Ambulatory Visit: Payer: Medicare Other | Admitting: Rehabilitation

## 2011-10-04 ENCOUNTER — Ambulatory Visit: Payer: Medicare Other | Admitting: Physical Therapy

## 2011-10-08 ENCOUNTER — Ambulatory Visit: Payer: Medicare Other | Admitting: Rehabilitation

## 2011-10-09 ENCOUNTER — Ambulatory Visit: Payer: Medicare Other | Admitting: Rehabilitation

## 2011-10-11 ENCOUNTER — Ambulatory Visit: Payer: Medicare Other | Admitting: Physical Therapy

## 2011-10-14 ENCOUNTER — Ambulatory Visit: Payer: Medicare Other | Admitting: Rehabilitation

## 2011-10-16 ENCOUNTER — Ambulatory Visit: Payer: Medicare Other | Attending: Orthopedic Surgery | Admitting: Rehabilitation

## 2011-10-16 DIAGNOSIS — M25669 Stiffness of unspecified knee, not elsewhere classified: Secondary | ICD-10-CM | POA: Insufficient documentation

## 2011-10-16 DIAGNOSIS — R262 Difficulty in walking, not elsewhere classified: Secondary | ICD-10-CM | POA: Insufficient documentation

## 2011-10-16 DIAGNOSIS — IMO0001 Reserved for inherently not codable concepts without codable children: Secondary | ICD-10-CM | POA: Insufficient documentation

## 2011-10-16 DIAGNOSIS — M25569 Pain in unspecified knee: Secondary | ICD-10-CM | POA: Insufficient documentation

## 2011-10-18 ENCOUNTER — Ambulatory Visit: Payer: Medicare Other | Admitting: Physical Therapy

## 2011-10-21 ENCOUNTER — Ambulatory Visit: Payer: Medicare Other | Admitting: Rehabilitation

## 2011-10-23 ENCOUNTER — Ambulatory Visit: Payer: Medicare Other | Admitting: Rehabilitation

## 2011-10-25 ENCOUNTER — Ambulatory Visit: Payer: Medicare Other | Admitting: Physical Therapy

## 2011-10-28 ENCOUNTER — Ambulatory Visit: Payer: Medicare Other | Admitting: Rehabilitation

## 2011-10-30 ENCOUNTER — Ambulatory Visit: Payer: Medicare Other | Admitting: Rehabilitation

## 2011-11-01 ENCOUNTER — Ambulatory Visit: Payer: Medicare Other | Admitting: Rehabilitation

## 2011-11-04 ENCOUNTER — Ambulatory Visit: Payer: Medicare Other | Admitting: Physical Therapy

## 2011-11-06 ENCOUNTER — Ambulatory Visit: Payer: Medicare Other | Admitting: Rehabilitation

## 2011-11-08 ENCOUNTER — Ambulatory Visit: Payer: Medicare Other | Admitting: Physical Therapy

## 2011-11-12 ENCOUNTER — Encounter: Payer: Medicare Other | Admitting: Rehabilitation

## 2011-11-13 ENCOUNTER — Encounter: Payer: Medicare Other | Admitting: Physical Therapy

## 2011-11-18 ENCOUNTER — Encounter: Payer: Medicare Other | Admitting: Rehabilitation

## 2011-11-20 ENCOUNTER — Encounter: Payer: Medicare Other | Admitting: Physical Therapy

## 2012-10-01 ENCOUNTER — Other Ambulatory Visit: Payer: Self-pay | Admitting: Family Medicine

## 2012-11-30 ENCOUNTER — Other Ambulatory Visit: Payer: Self-pay | Admitting: Family Medicine

## 2013-02-05 ENCOUNTER — Other Ambulatory Visit: Payer: Self-pay | Admitting: Family Medicine

## 2013-02-08 ENCOUNTER — Other Ambulatory Visit: Payer: Self-pay | Admitting: *Deleted

## 2013-02-08 DIAGNOSIS — R5381 Other malaise: Secondary | ICD-10-CM

## 2013-02-08 DIAGNOSIS — E785 Hyperlipidemia, unspecified: Secondary | ICD-10-CM

## 2013-02-08 DIAGNOSIS — I10 Essential (primary) hypertension: Secondary | ICD-10-CM

## 2013-02-09 ENCOUNTER — Other Ambulatory Visit: Payer: Medicare Other

## 2013-02-09 LAB — CBC WITH DIFFERENTIAL/PLATELET
Basophils Absolute: 0 10*3/uL (ref 0.0–0.1)
Basophils Relative: 1 % (ref 0–1)
Eosinophils Absolute: 0.1 10*3/uL (ref 0.0–0.7)
Eosinophils Relative: 2 % (ref 0–5)
HCT: 41.9 % (ref 39.0–52.0)
Hemoglobin: 14.5 g/dL (ref 13.0–17.0)
Lymphocytes Relative: 29 % (ref 12–46)
Lymphs Abs: 1.3 10*3/uL (ref 0.7–4.0)
MCH: 31.3 pg (ref 26.0–34.0)
MCHC: 34.6 g/dL (ref 30.0–36.0)
MCV: 90.5 fL (ref 78.0–100.0)
Monocytes Absolute: 0.5 10*3/uL (ref 0.1–1.0)
Monocytes Relative: 12 % (ref 3–12)
Neutro Abs: 2.5 10*3/uL (ref 1.7–7.7)
Neutrophils Relative %: 56 % (ref 43–77)
Platelets: 189 10*3/uL (ref 150–400)
RBC: 4.63 MIL/uL (ref 4.22–5.81)
RDW: 14.5 % (ref 11.5–15.5)
WBC: 4.4 10*3/uL (ref 4.0–10.5)

## 2013-02-09 LAB — COMPLETE METABOLIC PANEL WITH GFR
ALT: 24 U/L (ref 0–53)
AST: 20 U/L (ref 0–37)
Albumin: 4.5 g/dL (ref 3.5–5.2)
Alkaline Phosphatase: 54 U/L (ref 39–117)
BUN: 17 mg/dL (ref 6–23)
CO2: 32 mEq/L (ref 19–32)
Calcium: 9.4 mg/dL (ref 8.4–10.5)
Chloride: 102 mEq/L (ref 96–112)
Creat: 1.22 mg/dL (ref 0.50–1.35)
GFR, Est African American: 69 mL/min
GFR, Est Non African American: 60 mL/min
Glucose, Bld: 98 mg/dL (ref 70–99)
Potassium: 4.2 mEq/L (ref 3.5–5.3)
Sodium: 140 mEq/L (ref 135–145)
Total Bilirubin: 0.7 mg/dL (ref 0.3–1.2)
Total Protein: 7.2 g/dL (ref 6.0–8.3)

## 2013-02-09 LAB — LIPID PANEL
Cholesterol: 123 mg/dL (ref 0–200)
HDL: 36 mg/dL — ABNORMAL LOW (ref >39)
LDL Cholesterol: 58 mg/dL (ref 0–99)
Total CHOL/HDL Ratio: 3.4 Ratio
Triglycerides: 144 mg/dL (ref <150)
VLDL: 29 mg/dL (ref 0–40)

## 2013-02-22 ENCOUNTER — Encounter: Payer: Self-pay | Admitting: *Deleted

## 2013-02-22 DIAGNOSIS — E559 Vitamin D deficiency, unspecified: Secondary | ICD-10-CM | POA: Insufficient documentation

## 2013-02-22 DIAGNOSIS — K219 Gastro-esophageal reflux disease without esophagitis: Secondary | ICD-10-CM | POA: Insufficient documentation

## 2013-02-22 DIAGNOSIS — M810 Age-related osteoporosis without current pathological fracture: Secondary | ICD-10-CM | POA: Insufficient documentation

## 2013-02-22 DIAGNOSIS — E785 Hyperlipidemia, unspecified: Secondary | ICD-10-CM

## 2013-02-22 DIAGNOSIS — L719 Rosacea, unspecified: Secondary | ICD-10-CM | POA: Insufficient documentation

## 2013-02-22 DIAGNOSIS — I10 Essential (primary) hypertension: Secondary | ICD-10-CM

## 2013-02-22 DIAGNOSIS — G4733 Obstructive sleep apnea (adult) (pediatric): Secondary | ICD-10-CM

## 2013-02-23 ENCOUNTER — Encounter: Payer: Self-pay | Admitting: Family Medicine

## 2013-02-23 ENCOUNTER — Ambulatory Visit (INDEPENDENT_AMBULATORY_CARE_PROVIDER_SITE_OTHER): Payer: Medicare Other | Admitting: Family Medicine

## 2013-02-23 VITALS — BP 125/79 | HR 71 | Resp 16 | Wt 230.0 lb

## 2013-02-23 DIAGNOSIS — I10 Essential (primary) hypertension: Secondary | ICD-10-CM

## 2013-02-23 DIAGNOSIS — E876 Hypokalemia: Secondary | ICD-10-CM

## 2013-02-23 DIAGNOSIS — R7301 Impaired fasting glucose: Secondary | ICD-10-CM

## 2013-02-23 DIAGNOSIS — K429 Umbilical hernia without obstruction or gangrene: Secondary | ICD-10-CM

## 2013-02-23 DIAGNOSIS — E785 Hyperlipidemia, unspecified: Secondary | ICD-10-CM

## 2013-02-23 DIAGNOSIS — Z23 Encounter for immunization: Secondary | ICD-10-CM

## 2013-02-23 DIAGNOSIS — K219 Gastro-esophageal reflux disease without esophagitis: Secondary | ICD-10-CM

## 2013-02-23 DIAGNOSIS — M810 Age-related osteoporosis without current pathological fracture: Secondary | ICD-10-CM

## 2013-02-23 LAB — POCT GLYCOSYLATED HEMOGLOBIN (HGB A1C): Hemoglobin A1C: 5.9

## 2013-02-23 MED ORDER — LOSARTAN POTASSIUM 100 MG PO TABS: 100.0000 mg | ORAL_TABLET | Freq: Every day | ORAL | Status: DC

## 2013-02-23 MED ORDER — ALENDRONATE SODIUM 70 MG PO TABS: 70.0000 mg | ORAL_TABLET | ORAL | Status: AC

## 2013-02-23 MED ORDER — TRIAMTERENE-HCTZ 37.5-25 MG PO TABS: ORAL_TABLET | ORAL | Status: DC

## 2013-02-23 MED ORDER — AMLODIPINE BESYLATE 5 MG PO TABS: 5.0000 mg | ORAL_TABLET | Freq: Every day | ORAL | Status: DC

## 2013-02-23 MED ORDER — PRAVASTATIN SODIUM 40 MG PO TABS: 40.0000 mg | ORAL_TABLET | Freq: Every day | ORAL | Status: DC

## 2013-02-23 MED ORDER — CARVEDILOL 6.25 MG PO TABS: 6.2500 mg | ORAL_TABLET | Freq: Two times a day (BID) | ORAL | Status: DC

## 2013-02-23 MED ORDER — POTASSIUM CHLORIDE CRYS ER 20 MEQ PO TBCR: 20.0000 meq | EXTENDED_RELEASE_TABLET | Freq: Every day | ORAL | Status: DC

## 2013-02-23 MED ORDER — METFORMIN HCL ER 500 MG PO TB24: 1000.0000 mg | ORAL_TABLET | Freq: Every evening | ORAL | Status: DC

## 2013-02-23 MED ORDER — OMEPRAZOLE 20 MG PO CPDR: 20.0000 mg | DELAYED_RELEASE_CAPSULE | Freq: Two times a day (BID) | ORAL | Status: DC

## 2013-02-23 NOTE — Patient Instructions (Addendum)
Insulin Resistance  Blood sugar (glucose) levels are controlled by a hormone called insulin. Insulin is made by your pancreas. When your blood glucose goes up, insulin is released into your blood. Insulin is required for your body to function normally. However, your body can become resistant to your own insulin or to insulin given to treat diabetes. In either case, insulin resistance can lead to serious problems. These problems include:   Type 2 diabetes.   Heart disease.   High blood pressure.   Stroke.   Polycystic ovary syndrome.   Fatty liver.  CAUSES   Insulin resistance can develop for many different reasons. It is more likely to happen in people with these conditions or characteristics:   Obesity.   Inactivity.   Pregnancy.   High blood pressure.   Stress.   Steroid use.   Infection or severe illness.   Increased levels of cholesterol and triglycerides.  SYMPTOMS   There are no symptoms. You may have symptoms related to the various complications of insulin resistance.   DIAGNOSIS   Several different things can make your caregiver suspect you have insulin resistance. These include:   High blood glucose (hyperglycemia).   Abnormal cholesterol levels.   High uric acid levels.   Changes related to blood pressure.   Changes related to inflammation.  Insulin resistance can be determined with blood tests. An elevated insulin level when you have not eaten might suggest resistance. Other more complicated tests are sometimes necessary.  TREATMENT   Lifestyle changes are the most important treatment for insulin resistance.    If you are overweight and you have insulin resistance, you can improve your insulin sensitivity by losing weight.   Moderate exercise for 30 40 minutes, 4 days a week, can improve insulin sensitivity.  Some medicines can also help improve your insulin sensitivity. Your caregiver can discuss these with you if they are appropriate.   HOME CARE INSTRUCTIONS    Do not smoke.    Keep your weight at a healthy level.   Get exercise.   If you have diabetes, follow your caregiver's directions.   If you have high blood pressure, follow your caregiver's directions.   Only take prescription medicines for pain, fever, or discomfort as directed by your caregiver.  SEEK MEDICAL CARE IF:    You are diabetic and you are having problems keeping your blood glucose levels at target range.   You are having episodes of low blood glucose (hypoglycemia).   You feel you might be having side effects from your medicines.   You have symptoms of an illness that is not improving after 3 4 days.   You have a sore or wound that is not healing.   You notice a change in vision or a new problem with your vision.  SEEK IMMEDIATE MEDICAL CARE IF:    Your blood glucose goes below 70, especially if you have confusion, lightheadedness, or other symptoms with it.   Your blood glucose is very high (as advised by your caregiver) twice in a row.   You pass out.   You have chest pain or trouble breathing.   You have a sudden, severe headache.   You have sudden weakness in one arm or one leg.   You have sudden difficulty speaking or swallowing.   You develop vomiting or diarrhea that is getting worse or not improving after 1 day.  Document Released: 07/23/2005 Document Revised: 12/03/2011 Document Reviewed: 07/21/2008  ExitCare Patient Information 2014 [employer].

## 2013-02-23 NOTE — Progress Notes (Signed)
  Subjective:    Patient ID: Brett Armstrong, male    DOB: 1941-04-09, 73 y.o.   MRN: [id number]  HPI  Brett Armstrong is here today to go over his most recent lab results, discuss the conditions listed below and to get his medications refilled.   1)  Osteoporosis:   He takes Fosamax 70 mg once weekly.  He needs a refill on it.   2)  Hyperlipidemia:  He needs a refill on his pravastatin.    3)  Hypertension:  His blood pressure continues to be controlled with his carvedilol 6.25 mg triam/HCTZ 37.5-25 losartan 100 mg and amlodipine 5 mg.  He needs all these medications refilled.   4)  IFG:  He continues to do well with his metformin XR 500 mg twice daily.    Review of Systems  Constitutional: Negative.   HENT: Negative.   Eyes: Negative.   Respiratory: Negative.   Cardiovascular: Negative.   Gastrointestinal: Positive for abdominal pain and abdominal distention.       Umbilical Hernia   Endocrine: Negative.   Genitourinary: Negative.   Musculoskeletal: Negative.   Skin: Negative.   Allergic/Immunologic: Negative.   Neurological: Negative.   Hematological: Negative.   Psychiatric/Behavioral: Negative.      Past Medical History  Diagnosis Date  . Hypertension   . Diabetes mellitus     ON PILLS   . Chronic kidney disease     HX OF KIDNEY STONES   . GERD (gastroesophageal reflux disease)   . Arthritis     KNEES, HANDS   . Peripheral vascular disease     left leg circulaton problem per pt   . Rosacea   . ED (erectile dysfunction)   . Umbilical hernia   . Vitamin B 12 deficiency      Family History  Problem Relation Age of Onset  . Alzheimer's disease Mother   . Lung disease Father 20    Pulmonary Embolus     History   Social History Narrative   Marital Status: Married [name])    Children:  Daughters [name], [name])    Pets:  Dog (1)    Living Situation: Lives with spouse.   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 Constitutional: He is oriented to person, place, and time. He appears well-developed and well-nourished. No distress.  HENT:  Head: Normocephalic.  Eyes: No scleral icterus.  Neck: Neck supple. No thyromegaly present.  Cardiovascular: Normal rate, regular rhythm and normal heart sounds.   Pulmonary/Chest: Effort normal and breath sounds normal.  Abdominal: Soft. Bowel sounds are normal. He exhibits mass. There is tenderness.  Umbilical Hernia   Musculoskeletal: Normal range of motion. He exhibits no edema.  Neurological: He is alert and oriented to person, place, and time.  Skin: Skin is warm and dry. No rash noted.  Psychiatric: He has a normal mood and affect. His behavior is normal. Judgment and thought content normal.          Assessment & Plan:

## 2013-02-24 ENCOUNTER — Telehealth: Payer: Self-pay | Admitting: *Deleted

## 2013-02-24 NOTE — Telephone Encounter (Signed)
Patient is aware of his attp with Dr Abbey Chatters on 03/08/2013. PG

## 2013-03-08 ENCOUNTER — Ambulatory Visit (INDEPENDENT_AMBULATORY_CARE_PROVIDER_SITE_OTHER): Payer: Medicare Other | Admitting: General Surgery

## 2013-03-08 ENCOUNTER — Encounter (INDEPENDENT_AMBULATORY_CARE_PROVIDER_SITE_OTHER): Payer: Self-pay | Admitting: General Surgery

## 2013-03-08 VITALS — BP 140/82 | HR 72 | Resp 16 | Ht 69.0 in | Wt 234.4 lb

## 2013-03-08 DIAGNOSIS — K429 Umbilical hernia without obstruction or gangrene: Secondary | ICD-10-CM

## 2013-03-08 NOTE — Patient Instructions (Signed)
Avoid a lot of core exercises. Call when you would like to have surgery.

## 2013-03-08 NOTE — Progress Notes (Signed)
Patient ID: Brett Armstrong, male   DOB: 1940-09-16, 72 y.o.   MRN: [id number]  Chief Complaint  Patient presents with  . New Evaluation     eval umb hernia    HPI Brett Armstrong is a 72 y.o. male.   HPI  He is referred by [facility] because of a recurrent umbilical hernia that is getting larger.  He underwent repair in 1990. He thinks they used mesh. He noticed it about a year and a half ago and is slowly getting larger. It is to the right of the umbilicus. He states his last hernia look this way as well. He denies any pain or obstructive symptoms from it.  Past Medical History  Diagnosis Date  . Hypertension   . Diabetes mellitus     ON PILLS   . Chronic kidney disease     HX OF KIDNEY STONES   . GERD (gastroesophageal reflux disease)   . Arthritis     KNEES, HANDS   . Peripheral vascular disease     left leg circulaton problem per pt   . Rosacea   . ED (erectile dysfunction)   . Umbilical hernia   . Vitamin B 12 deficiency     Past Surgical History  Procedure Laterality Date  . Lithotripsy      X 4  . Appendectomy    . Hernia repair      UMBILICAL   . Other surgical history      TUMOR REMOVED RIGHT FACE - BENIGN   . Total knee arthroplasty  06/24/2011    Procedure: TOTAL KNEE ARTHROPLASTY;  Surgeon: [name];  Location: [facility];  Service: Orthopedics;  Laterality: Right;  . Other surgical history      small facial tumor removal 1970s  . Total knee arthroplasty  09/03/2011    Procedure: TOTAL KNEE ARTHROPLASTY;  Surgeon: [name], MD;  Location: [facility];  Service: Orthopedics;  Laterality: Left;    Family History  Problem Relation Age of Onset  . Alzheimer's disease Mother   . Lung disease Father 58    Pulmonary Embolus    Social History History  Substance Use Topics  . Smoking status: Never Smoker   . Smokeless tobacco: Never Used  . Alcohol Use: No    Allergies  Allergen Reactions  . Methocarbamol [Methocarbamol] Nausea And Vomiting    Current  Outpatient Prescriptions  Medication Sig Dispense Refill  . alendronate (FOSAMAX) 70 MG tablet Take 1 tablet (70 mg total) by mouth every 7 (seven) days. Take with a full glass of water on an empty stomach. Saturday  12 tablet  3  . amLODipine (NORVASC) 5 MG tablet Take 1 tablet (5 mg total) by mouth daily after breakfast.  90 tablet  3  . aspirin EC 325 MG tablet Take 1 tablet (325 mg total) by mouth 2 (two) times daily.  60 tablet    . carvedilol (COREG) 6.25 MG tablet Take 1 tablet (6.25 mg total) by mouth 2 (two) times daily with a meal.  180 tablet  3  . Cholecalciferol (VITAMIN D) 2000 UNITS tablet Take 2,000 Units by mouth daily.       Marland Kitchen losartan (COZAAR) 100 MG tablet Take 1 tablet (100 mg total) by mouth daily after breakfast.  90 tablet  3  . metFORMIN (GLUCOPHAGE-XR) 500 MG 24 hr tablet Take 2 tablets (1,000 mg total) by mouth every evening.  60 tablet  5  . omeprazole (PRILOSEC) 20 MG capsule Take  1 capsule (20 mg total) by mouth 2 (two) times daily.  180 capsule  3  . potassium chloride SA (K-DUR,KLOR-CON) 20 MEQ tablet Take 1 tablet (20 mEq total) by mouth daily.  90 tablet  3  . pravastatin (PRAVACHOL) 40 MG tablet Take 1 tablet (40 mg total) by mouth at bedtime.  90 tablet  3  . triamterene-hydrochlorothiazide (MAXZIDE-25) 37.5-25 MG per tablet Take 1/2 - 1 tab daily  90 tablet  1  . vitamin B-12 (CYANOCOBALAMIN) 1000 MCG tablet Take 1,000 mcg by mouth daily.        No current facility-administered medications for this visit.    Review of Systems Review of Systems  Constitutional:       Weight gain  HENT: Positive for hearing loss and trouble swallowing.   Cardiovascular: Positive for leg swelling.  Gastrointestinal: Negative for abdominal pain.    Blood pressure 140/82, pulse 72, resp. rate 16, height 5\' 9"  (1.753 m), weight 234 lb 6.4 oz (106.323 kg).  Physical Exam Physical Exam  Constitutional: No distress.  Overweight male  HENT:  Head: Normocephalic and  atraumatic.  Cardiovascular: Normal rate and regular rhythm.   Pulmonary/Chest: Effort normal and breath sounds normal.  Abdominal: Soft. There is no tenderness.  Right lower quadrant scar. Subumbilical scar. At the 10:00 position with respect to the umbilicus there is a reducible bulge.    Data Reviewed EPIC note  Assessment    Recurrent umbilical hernia that is getting larger. He is interested in repair but not at this current time.     Plan    I recommended a laparoscopic repair of the recurrent umbilical hernia when he decides to have it done. I went over the reasoning for this with him. He will call back when he wants to schedule the operation.  I have discussed the procedure, risks, and aftercare.  Risks include but are not limited to bleeding, infection, wound problems, anesthesia, recurrence, injury to intestine, mesh problems.  We also discussed not knowing what the umbilicus would look like in the future.  He seems to understand and agrees with the plan.        Lynx Goodrich J 03/08/2013, 9:53 AM

## 2013-04-25 DIAGNOSIS — Z23 Encounter for immunization: Secondary | ICD-10-CM | POA: Insufficient documentation

## 2013-04-25 DIAGNOSIS — R7301 Impaired fasting glucose: Secondary | ICD-10-CM | POA: Insufficient documentation

## 2013-04-25 DIAGNOSIS — K429 Umbilical hernia without obstruction or gangrene: Secondary | ICD-10-CM | POA: Insufficient documentation

## 2013-04-25 DIAGNOSIS — E876 Hypokalemia: Secondary | ICD-10-CM | POA: Insufficient documentation

## 2013-04-25 NOTE — Assessment & Plan Note (Signed)
Refilled his omeprazole.

## 2013-04-25 NOTE — Assessment & Plan Note (Signed)
Referring to Dr. Abbey Chatters.

## 2013-04-25 NOTE — Assessment & Plan Note (Signed)
Refilled his Fosamax.

## 2013-04-25 NOTE — Assessment & Plan Note (Signed)
The patient confirmed that they are not allergic to eggs and have never had a bad reaction with the flu shot in the past.  The vaccination was given without difficulty.   

## 2013-04-25 NOTE — Assessment & Plan Note (Signed)
His lipid panel is well controlled on pravastatin so he'll remain on his current dosage.

## 2013-04-25 NOTE — Assessment & Plan Note (Signed)
Refilled his K-Dur.

## 2013-04-25 NOTE — Assessment & Plan Note (Signed)
Refilled his BP meds.

## 2013-04-25 NOTE — Assessment & Plan Note (Signed)
His blood sugar is well controlled on the metformin (A1c of 5.9%).

## 2013-08-25 ENCOUNTER — Ambulatory Visit: Payer: Medicare Other | Admitting: Family Medicine

## 2013-08-26 ENCOUNTER — Ambulatory Visit (INDEPENDENT_AMBULATORY_CARE_PROVIDER_SITE_OTHER): Payer: Medicare HMO | Admitting: Family Medicine

## 2013-08-26 ENCOUNTER — Encounter: Payer: Self-pay | Admitting: Family Medicine

## 2013-08-26 VITALS — BP 102/66 | HR 87 | Resp 16 | Wt 236.0 lb

## 2013-08-26 DIAGNOSIS — E785 Hyperlipidemia, unspecified: Secondary | ICD-10-CM

## 2013-08-26 DIAGNOSIS — R7301 Impaired fasting glucose: Secondary | ICD-10-CM

## 2013-08-26 DIAGNOSIS — I10 Essential (primary) hypertension: Secondary | ICD-10-CM

## 2013-08-26 DIAGNOSIS — K219 Gastro-esophageal reflux disease without esophagitis: Secondary | ICD-10-CM

## 2013-08-26 LAB — POCT GLYCOSYLATED HEMOGLOBIN (HGB A1C): Hemoglobin A1C: 6.1

## 2013-08-26 MED ORDER — PRAVASTATIN SODIUM 40 MG PO TABS
40.0000 mg | ORAL_TABLET | Freq: Every day | ORAL | Status: DC
Start: 2013-08-26 — End: 2019-05-26

## 2013-08-26 MED ORDER — TRIAMTERENE-HCTZ 37.5-25 MG PO TABS: ORAL_TABLET | ORAL | Status: DC

## 2013-08-26 MED ORDER — CARVEDILOL 6.25 MG PO TABS: 6.2500 mg | ORAL_TABLET | Freq: Two times a day (BID) | ORAL | Status: DC

## 2013-08-26 MED ORDER — LOSARTAN POTASSIUM 100 MG PO TABS: 100.0000 mg | ORAL_TABLET | Freq: Every day | ORAL | Status: DC

## 2013-08-26 MED ORDER — AMLODIPINE BESYLATE 5 MG PO TABS: 5.0000 mg | ORAL_TABLET | Freq: Every day | ORAL | Status: DC

## 2013-08-26 NOTE — Progress Notes (Signed)
Subjective:    Patient ID: Brett Armstrong, male    DOB: [birth year], 73 y.o.   MRN: [id number]  [place] is here today to discuss the conditions listed below:  1)  IFG - He is doing well with his Metformin (500 mg daily).  He needs his A1c rechecked.   2)  Hypertension - His blood pressure is well controlled the combination of Carvedilol, Amlodipine, Losartan and Triam/HCTZ.  He needs his Triam/HCTZ refilled.   3)  Hyperlipidemia - He is doing fine on pravastatin and needs a refill.     Review of Systems  Constitutional: Negative for fatigue and unexpected weight change.  HENT: Negative.   Respiratory: Negative for shortness of breath.   Cardiovascular: Negative for chest pain, palpitations and leg swelling.  Gastrointestinal: Negative.   Genitourinary: Negative.   Musculoskeletal: Negative for myalgias.  Skin: Negative.   Neurological: Negative.   Psychiatric/Behavioral: Negative.     Past Medical History  Diagnosis Date  . Hypertension   . Diabetes mellitus     ON PILLS   . Chronic kidney disease     HX OF KIDNEY STONES   . GERD (gastroesophageal reflux disease)   . Arthritis     KNEES, HANDS   . Peripheral vascular disease     left leg circulaton problem per pt   . Rosacea   . ED (erectile dysfunction)   . Umbilical hernia   . Vitamin B 12 deficiency      Past Surgical History  Procedure Laterality Date  . Lithotripsy      X 4  . Appendectomy    . Hernia repair      UMBILICAL   . Other surgical history      TUMOR REMOVED RIGHT FACE - BENIGN   . Total knee arthroplasty  06/24/2011    Procedure: TOTAL KNEE ARTHROPLASTY;  Surgeon: Mauri Pole;  Location: [facility];  Service: Orthopedics;  Laterality: Right;  . Other surgical history      small facial tumor removal 1970s  . Total knee arthroplasty  09/03/2011    Procedure: TOTAL KNEE ARTHROPLASTY;  Surgeon: Mauri Pole, MD;  Location: [facility];  Service: Orthopedics;  Laterality: Left;     History    Social History Narrative   Marital Status: Married Romie Minus)    Children:  Daughters Caren Griffins, Santiago Glad)    Pets:  Dog (1)    Living Situation: Lives with spouse.   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 Alzheimer's disease Mother   . Lung disease Father 17    Pulmonary Embolus     Current Outpatient Prescriptions on File Prior to Visit  Medication Sig Dispense Refill  . alendronate (FOSAMAX) 70 MG tablet Take 1 tablet (70 mg total) by mouth every 7 (seven) days. Take with a full glass of water on an empty stomach. Saturday  12 tablet  3  . aspirin EC 325 MG tablet Take 1 tablet (325 mg total) by mouth 2 (two) times daily.  60 tablet    . Cholecalciferol (VITAMIN D) 2000 UNITS tablet Take 2,000 Units by mouth daily.       Marland Kitchen  metFORMIN (GLUCOPHAGE-XR) 500 MG 24 hr tablet Take 2 tablets (1,000 mg total) by mouth every evening.  60 tablet  5  . omeprazole (PRILOSEC) 20 MG capsule Take 1 capsule (20 mg total) by mouth 2 (two) times daily.  180 capsule  3  . vitamin B-12 (CYANOCOBALAMIN) 1000 MCG tablet Take 1,000 mcg by mouth daily.        No current facility-administered medications on file prior to visit.     Allergies  Allergen Reactions  . Methocarbamol [Methocarbamol] Nausea And Vomiting     Immunization History  Administered Date(s) Administered  . Influenza,inj,Quad PF,36+ Mos 02/23/2013  . Pneumococcal Conjugate-13 06/01/2009  . Td 02/26/2005  . Tdap 12/30/2011  . Zoster 06/01/2009       Objective:   Physical Exam  Constitutional: He is oriented to person, place, and time. He appears well-nourished. No distress.  HENT:  Head: Normocephalic.  Eyes: No scleral icterus.  Neck: Neck supple. No thyromegaly present.   Cardiovascular: Normal rate, regular rhythm and normal heart sounds.  Exam reveals no gallop and no friction rub.   No murmur heard. Pulmonary/Chest: Breath sounds normal. No respiratory distress. He exhibits no tenderness.  Musculoskeletal: He exhibits no edema.  Neurological: He is alert and oriented to person, place, and time.  Skin: Skin is warm and dry. No rash noted.  Psychiatric: He has a normal mood and affect. His behavior is normal. Judgment and thought content normal.      Assessment & Plan:    [place] was seen today for medication management.  Diagnoses and associated orders for this visit:  IFG (impaired fasting glucose) - POCT glycosylated hemoglobin (Hb A1C)  Essential hypertension, benign - triamterene-hydrochlorothiazide (MAXZIDE-25) 37.5-25 MG per tablet; Take 1/2 - 1 tab daily - carvedilol (COREG) 6.25 MG tablet; Take 1 tablet (6.25 mg total) by mouth 2 (two) times daily with a meal. - amLODipine (NORVASC) 5 MG tablet; Take 1 tablet (5 mg total) by mouth daily after breakfast. - losartan (COZAAR) 100 MG tablet; Take 1 tablet (100 mg total) by mouth daily after breakfast.  Other and unspecified hyperlipidemia - pravastatin (PRAVACHOL) 40 MG tablet; Take 1 tablet (40 mg total) by mouth at bedtime.  GERD (gastroesophageal reflux disease)

## 2013-10-18 ENCOUNTER — Telehealth: Payer: Self-pay

## 2013-10-18 ENCOUNTER — Other Ambulatory Visit: Payer: Self-pay | Admitting: *Deleted

## 2013-10-18 DIAGNOSIS — E876 Hypokalemia: Secondary | ICD-10-CM

## 2013-10-18 MED ORDER — POTASSIUM CHLORIDE CRYS ER 20 MEQ PO TBCR: 20.0000 meq | EXTENDED_RELEASE_TABLET | Freq: Every day | ORAL | Status: DC

## 2013-10-18 NOTE — Telephone Encounter (Signed)
Brett Armstrong needs to get a prescription for sent to the [facility] [address]

## 2013-10-30 DIAGNOSIS — R7301 Impaired fasting glucose: Secondary | ICD-10-CM | POA: Insufficient documentation

## 2014-02-17 ENCOUNTER — Other Ambulatory Visit: Payer: Medicare HMO

## 2014-02-24 ENCOUNTER — Ambulatory Visit: Payer: Medicare HMO | Admitting: Family Medicine

## 2014-02-28 ENCOUNTER — Other Ambulatory Visit: Payer: Self-pay | Admitting: Family Medicine

## 2014-06-17 DIAGNOSIS — Z86718 Personal history of other venous thrombosis and embolism: Secondary | ICD-10-CM

## 2014-06-17 HISTORY — DX: Personal history of other venous thrombosis and embolism: Z86.718

## 2016-07-24 DIAGNOSIS — G4733 Obstructive sleep apnea (adult) (pediatric): Secondary | ICD-10-CM | POA: Diagnosis not present

## 2016-07-24 DIAGNOSIS — G4761 Periodic limb movement disorder: Secondary | ICD-10-CM | POA: Diagnosis not present

## 2016-07-24 DIAGNOSIS — E1165 Type 2 diabetes mellitus with hyperglycemia: Secondary | ICD-10-CM | POA: Diagnosis not present

## 2016-07-24 DIAGNOSIS — I10 Essential (primary) hypertension: Secondary | ICD-10-CM | POA: Diagnosis not present

## 2016-07-24 DIAGNOSIS — E559 Vitamin D deficiency, unspecified: Secondary | ICD-10-CM | POA: Diagnosis not present

## 2016-07-24 DIAGNOSIS — Z794 Long term (current) use of insulin: Secondary | ICD-10-CM | POA: Diagnosis not present

## 2016-07-24 DIAGNOSIS — E782 Mixed hyperlipidemia: Secondary | ICD-10-CM | POA: Diagnosis not present

## 2016-07-31 DIAGNOSIS — R0789 Other chest pain: Secondary | ICD-10-CM | POA: Diagnosis not present

## 2016-07-31 DIAGNOSIS — I10 Essential (primary) hypertension: Secondary | ICD-10-CM | POA: Diagnosis not present

## 2016-07-31 DIAGNOSIS — Z125 Encounter for screening for malignant neoplasm of prostate: Secondary | ICD-10-CM | POA: Diagnosis not present

## 2016-07-31 DIAGNOSIS — K219 Gastro-esophageal reflux disease without esophagitis: Secondary | ICD-10-CM | POA: Diagnosis not present

## 2016-07-31 DIAGNOSIS — E039 Hypothyroidism, unspecified: Secondary | ICD-10-CM | POA: Diagnosis not present

## 2016-07-31 DIAGNOSIS — E119 Type 2 diabetes mellitus without complications: Secondary | ICD-10-CM | POA: Diagnosis not present

## 2016-08-05 ENCOUNTER — Encounter: Payer: Self-pay | Admitting: Cardiology

## 2016-08-09 ENCOUNTER — Telehealth: Payer: Self-pay | Admitting: Cardiology

## 2016-08-09 NOTE — Telephone Encounter (Signed)
Received records from [place] for appointment on 08/15/16 with Dr Stanford Breed.  Records put with Dr Jacalyn Lefevre schedule for 08/15/16. lp

## 2016-08-12 NOTE — Progress Notes (Signed)
HPI: 76 year old male for evaluation of chest pain. Patient states that for 6 months he has had occasional chest tightness. It occurs only at night. It lasts 15 minutes and resolves. No radiation or associated symptoms. No associated water brash. Not pleuritic or positional. He does not have exertional chest pain. He denies dyspnea on exertion, orthopnea, PND or syncope. Because of the above we were asked to evaluate.  Current Outpatient Prescriptions  Medication Sig Dispense Refill  . aspirin EC 325 MG tablet Take 1 tablet (325 mg total) by mouth 2 (two) times daily. 60 tablet   . levothyroxine (SYNTHROID, LEVOTHROID) 88 MCG tablet Take 88 mcg by mouth daily before breakfast.    . losartan-hydrochlorothiazide (HYZAAR) 100-25 MG tablet Take 1 tablet by mouth every morning.    . NONFORMULARY OR COMPOUNDED ITEM Apply 1 application topically as needed. Clindamycin/Benzoil 1:2 Topical Gel 5%    . vitamin B-12 (CYANOCOBALAMIN) 1000 MCG tablet Take 1,000 mcg by mouth daily.     Marland Kitchen amLODipine (NORVASC) 5 MG tablet Take 1 tablet (5 mg total) by mouth daily after breakfast. 90 tablet 3  . carvedilol (COREG) 6.25 MG tablet Take 1 tablet (6.25 mg total) by mouth 2 (two) times daily with a meal. 180 tablet 3  . losartan (COZAAR) 100 MG tablet Take 1 tablet (100 mg total) by mouth daily after breakfast. 90 tablet 3  . metFORMIN (GLUCOPHAGE-XR) 500 MG 24 hr tablet Take 2 tablets (1,000 mg total) by mouth every evening. 60 tablet 5  . omeprazole (PRILOSEC) 20 MG capsule Take 1 capsule (20 mg total) by mouth 2 (two) times daily. 180 capsule 3  . pravastatin (PRAVACHOL) 40 MG tablet Take 1 tablet (40 mg total) by mouth at bedtime. 90 tablet 3   No current facility-administered medications for this visit.     Allergies  Allergen Reactions  . Methocarbamol [Methocarbamol] Nausea And Vomiting     Past Medical History:  Diagnosis Date  . Arthritis    KNEES, HANDS   . Chronic kidney disease    HX OF  KIDNEY STONES   . Diabetes mellitus    ON PILLS   . ED (erectile dysfunction)   . GERD (gastroesophageal reflux disease)   . Hyperlipidemia   . Hypertension   . Hypothyroid   . Nephrolithiasis   . OSA (obstructive sleep apnea)   . Peripheral vascular disease ([place])    left leg circulaton problem per pt   . Rosacea   . Umbilical hernia   . Vitamin B 12 deficiency     Past Surgical History:  Procedure Laterality Date  . APPENDECTOMY    . HERNIA REPAIR     UMBILICAL   . LITHOTRIPSY     X 4  . OTHER SURGICAL HISTORY     TUMOR REMOVED RIGHT FACE - BENIGN   . OTHER SURGICAL HISTORY     small facial tumor removal 1970s  . TOTAL KNEE ARTHROPLASTY  06/24/2011   Procedure: TOTAL KNEE ARTHROPLASTY;  Surgeon: Mauri Pole;  Location: [facility];  Service: Orthopedics;  Laterality: Right;  . TOTAL KNEE ARTHROPLASTY  09/03/2011   Procedure: TOTAL KNEE ARTHROPLASTY;  Surgeon: Mauri Pole, MD;  Location: [facility];  Service: Orthopedics;  Laterality: Left;    Social History   Social History  . Marital status: Married    Spouse name: Romie Minus  . Number of children: 2  . Years of education: 12   Occupational History  . Not on  file.   Social History Main Topics  . Smoking status: Never Smoker  . Smokeless tobacco: Never Used  . Alcohol use No  . Drug use: No  . Sexual activity: Yes    Partners: Female   Other Topics Concern  . Not on file   Social History Narrative   Marital Status: Married Romie Minus)    Children:  Daughters Caren Griffins, Santiago Glad)    Pets:  Dog (1)    Living Situation: Lives with spouse.   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 Alzheimer's disease Mother   . CAD Mother   . Lung disease Father 2    Pulmonary Embolus    ROS:  Some back pain but no fevers or chills, productive cough, hemoptysis, dysphasia, odynophagia, melena, hematochezia, dysuria, hematuria, rash, seizure activity, orthopnea, PND, pedal edema, claudication. Remaining systems are negative.  Physical Exam:   Blood pressure 128/66, pulse 75, height 5\' 10"  (1.778 m), weight 233 lb (105.7 kg).  General:  Well developed/well nourished in NAD Skin warm/dry Patient not depressed No peripheral clubbing Back-normal HEENT-normal/normal eyelids Neck supple/normal carotid upstroke bilaterally; no bruits; no JVD; no thyromegaly chest - CTA/ normal expansion CV - RRR/normal S1 and S2; no murmurs, rubs or gallops;  PMI nondisplaced Abdomen -NT/ND, no HSM, no mass, + bowel sounds, no bruit 2+ femoral pulses, no bruits Ext-trace edema LLE, no chords, 2+ DP Neuro-grossly nonfocal  ECG -sinus rhythm at a rate of 75, first-degree AV block, right bundle branch block. personally reviewed  A/P  1 chest pain-symptoms are atypical. We will arrange a stress nuclear study for risk stratification. Patient has baseline ECG abnormalities. Question GI etiology.  2 hypertension-blood pressure controlled. Continue present medications.   3 hyperlipidemia-continue statin.    Kirk Ruths, MD

## 2016-08-15 ENCOUNTER — Ambulatory Visit (INDEPENDENT_AMBULATORY_CARE_PROVIDER_SITE_OTHER): Payer: PPO | Admitting: Cardiology

## 2016-08-15 ENCOUNTER — Encounter: Payer: Self-pay | Admitting: Cardiology

## 2016-08-15 VITALS — BP 128/66 | HR 75 | Ht 70.0 in | Wt 233.0 lb

## 2016-08-15 DIAGNOSIS — I10 Essential (primary) hypertension: Secondary | ICD-10-CM

## 2016-08-15 DIAGNOSIS — E7800 Pure hypercholesterolemia, unspecified: Secondary | ICD-10-CM

## 2016-08-15 DIAGNOSIS — R072 Precordial pain: Secondary | ICD-10-CM

## 2016-08-15 NOTE — Patient Instructions (Signed)
Medication Instructions:   NO CHANGE  Testing/Procedures:  Your physician has requested that you have en exercise stress myoview. For further information please visit [email or web address]. Please follow instruction sheet, as given.DO NOT TAKE CARVEDILOL THE MORNING OF THE TEST    Follow-Up:  Your physician recommends that you schedule a follow-up appointment in: AS NEEDED PENDING TEST RESULTS

## 2016-08-21 DIAGNOSIS — Z87442 Personal history of urinary calculi: Secondary | ICD-10-CM | POA: Diagnosis not present

## 2016-08-21 DIAGNOSIS — R399 Unspecified symptoms and signs involving the genitourinary system: Secondary | ICD-10-CM | POA: Diagnosis not present

## 2016-08-21 DIAGNOSIS — N400 Enlarged prostate without lower urinary tract symptoms: Secondary | ICD-10-CM | POA: Diagnosis not present

## 2016-08-23 ENCOUNTER — Telehealth (HOSPITAL_COMMUNITY): Payer: Self-pay

## 2016-08-23 NOTE — Telephone Encounter (Signed)
Encounter complete. 

## 2016-08-28 ENCOUNTER — Ambulatory Visit (HOSPITAL_COMMUNITY)
Admission: RE | Admit: 2016-08-28 | Discharge: 2016-08-28 | Disposition: A | Discharge status: Home / Self Care | Payer: PPO | Source: Ambulatory Visit | Attending: Cardiovascular Disease | Admitting: Cardiovascular Disease

## 2016-08-28 DIAGNOSIS — I10 Essential (primary) hypertension: Secondary | ICD-10-CM | POA: Insufficient documentation

## 2016-08-28 DIAGNOSIS — E669 Obesity, unspecified: Secondary | ICD-10-CM | POA: Insufficient documentation

## 2016-08-28 DIAGNOSIS — Z6833 Body mass index (BMI) 33.0-33.9, adult: Secondary | ICD-10-CM | POA: Insufficient documentation

## 2016-08-28 DIAGNOSIS — R0602 Shortness of breath: Secondary | ICD-10-CM | POA: Insufficient documentation

## 2016-08-28 DIAGNOSIS — R072 Precordial pain: Secondary | ICD-10-CM | POA: Diagnosis not present

## 2016-08-28 DIAGNOSIS — G4733 Obstructive sleep apnea (adult) (pediatric): Secondary | ICD-10-CM | POA: Insufficient documentation

## 2016-08-28 DIAGNOSIS — I4510 Unspecified right bundle-branch block: Secondary | ICD-10-CM | POA: Diagnosis not present

## 2016-08-28 DIAGNOSIS — Z8249 Family history of ischemic heart disease and other diseases of the circulatory system: Secondary | ICD-10-CM | POA: Diagnosis not present

## 2016-08-28 DIAGNOSIS — E1151 Type 2 diabetes mellitus with diabetic peripheral angiopathy without gangrene: Secondary | ICD-10-CM | POA: Insufficient documentation

## 2016-08-28 DIAGNOSIS — E039 Hypothyroidism, unspecified: Secondary | ICD-10-CM | POA: Insufficient documentation

## 2016-08-28 LAB — MYOCARDIAL PERFUSION IMAGING
CHL CUP MPHR: 147 {beats}/min
CHL CUP NUCLEAR SSS: 4
CHL RATE OF PERCEIVED EXERTION: 17
CSEPED: 7 min
CSEPEDS: 38 s
CSEPEW: 9.5 METS
LV dias vol: 122 mL (ref 62–150)
LV sys vol: 50 mL
Peak HR: 169 {beats}/min
Percent HR: 114 %
Rest HR: 68 {beats}/min
SDS: 3
SRS: 4
TID: 0.88

## 2016-08-28 MED ORDER — TECHNETIUM TC 99M TETROFOSMIN IV KIT
31.4000 | PACK | Freq: Once | INTRAVENOUS | Status: AC | PRN
  Administered 2016-08-28: 31.4 via INTRAVENOUS
  Filled 2016-08-28: qty 32

## 2016-08-28 MED ORDER — TECHNETIUM TC 99M TETROFOSMIN IV KIT
10.4000 | PACK | Freq: Once | INTRAVENOUS | Status: AC | PRN
  Administered 2016-08-28: 10.4 via INTRAVENOUS
  Filled 2016-08-28: qty 11

## 2016-11-06 DIAGNOSIS — H2513 Age-related nuclear cataract, bilateral: Secondary | ICD-10-CM | POA: Diagnosis not present

## 2016-11-06 DIAGNOSIS — Z7984 Long term (current) use of oral hypoglycemic drugs: Secondary | ICD-10-CM | POA: Diagnosis not present

## 2016-11-06 DIAGNOSIS — E119 Type 2 diabetes mellitus without complications: Secondary | ICD-10-CM | POA: Diagnosis not present

## 2016-11-14 DIAGNOSIS — G4733 Obstructive sleep apnea (adult) (pediatric): Secondary | ICD-10-CM | POA: Diagnosis not present

## 2017-05-17 IMAGING — NM NM MISC PROCEDURE
9 series · 54 of 54 positions shown · non-contrast
Comparison: none

[Series 1: wbr rest · 6.40mm/px · 6 of 64 frames shown]
[frame 6/64  full-range]
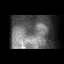
[frame 16/64  full-range]
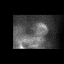
[frame 27/64  full-range]
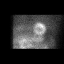
[frame 38/64  full-range]
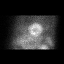
[frame 48/64  full-range]
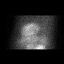
[frame 59/64  full-range]
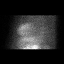

[Series 1: wbr_r-proj_st wbr rest · 6.40mm/px · 6 of 64 frames shown]
[frame 6/64]
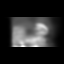
[frame 16/64]
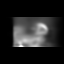
[frame 27/64]
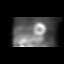
[frame 38/64]
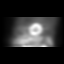
[frame 48/64]
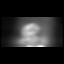
[frame 59/64]
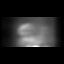

[Series 1: rest sax · 6.4mm · 6.40mm/px · 6 of 25 frames shown]
[frame 3/25]
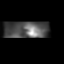
[frame 7/25]
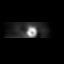
[frame 11/25]
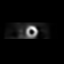
[frame 15/25]
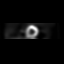
[frame 19/25]
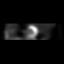
[frame 23/25]
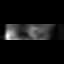

[Series 2: stress sax gs · 6.4mm · 6.40mm/px · 6 of 200 frames shown]
[frame 17/200]
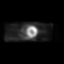
[frame 50/200]
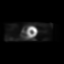
[frame 84/200]
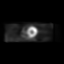
[frame 117/200]
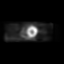
[frame 150/200]
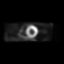
[frame 184/200]
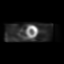

[Series 2: wbr_s-proj_st wbr stress-gsp · 6.40mm/px · 6 of 512 frames shown]
[frame 43/512]
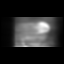
[frame 128/512]
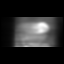
[frame 214/512]
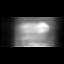
[frame 299/512]
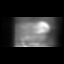
[frame 384/512]
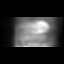
[frame 470/512]
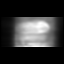

[Series 2: stress sax · 6.4mm · 6.40mm/px · 6 of 25 frames shown]
[frame 3/25]
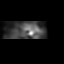
[frame 7/25]
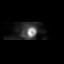
[frame 11/25]
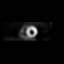
[frame 15/25]
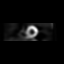
[frame 19/25]
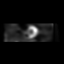
[frame 23/25]
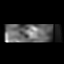

[Series 2: wbr stress-gsp · 6.40mm/px · 6 of 507 frames shown]
[frame 43/507  full-range]
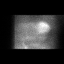
[frame 127/507  full-range]
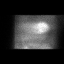
[frame 212/507  full-range]
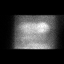
[frame 296/507  full-range]
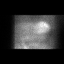
[frame 381/507  full-range]
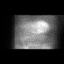
[frame 465/507  full-range]
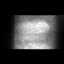

[Series 3: wbr stress-sum-em · 6.40mm/px · 6 of 64 frames shown]
[frame 6/64]
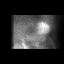
[frame 16/64]
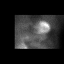
[frame 27/64]
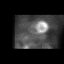
[frame 38/64]
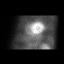
[frame 48/64]
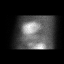
[frame 59/64]
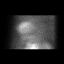

[Series 3: wbr_s-proj_st wbr stress-sum-em · 6.40mm/px · 6 of 64 frames shown]
[frame 6/64]
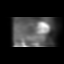
[frame 16/64]
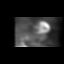
[frame 27/64]
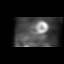
[frame 38/64]
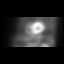
[frame 48/64]
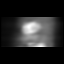
[frame 59/64]
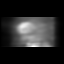

[54 of 54 positions shown; findings below may reference images not displayed]

Canned report from images found in remote index.

Refer to host system for actual result text.

## 2017-07-23 DIAGNOSIS — Z125 Encounter for screening for malignant neoplasm of prostate: Secondary | ICD-10-CM | POA: Diagnosis not present

## 2017-07-23 DIAGNOSIS — E119 Type 2 diabetes mellitus without complications: Secondary | ICD-10-CM | POA: Diagnosis not present

## 2017-07-23 DIAGNOSIS — E559 Vitamin D deficiency, unspecified: Secondary | ICD-10-CM | POA: Diagnosis not present

## 2017-07-23 DIAGNOSIS — I10 Essential (primary) hypertension: Secondary | ICD-10-CM | POA: Diagnosis not present

## 2017-08-13 DIAGNOSIS — I10 Essential (primary) hypertension: Secondary | ICD-10-CM | POA: Diagnosis not present

## 2017-08-13 DIAGNOSIS — K219 Gastro-esophageal reflux disease without esophagitis: Secondary | ICD-10-CM | POA: Diagnosis not present

## 2017-08-13 DIAGNOSIS — R7301 Impaired fasting glucose: Secondary | ICD-10-CM | POA: Diagnosis not present

## 2017-12-10 DIAGNOSIS — R05 Cough: Secondary | ICD-10-CM | POA: Diagnosis not present

## 2017-12-10 DIAGNOSIS — R0981 Nasal congestion: Secondary | ICD-10-CM | POA: Diagnosis not present

## 2017-12-10 DIAGNOSIS — J029 Acute pharyngitis, unspecified: Secondary | ICD-10-CM | POA: Diagnosis not present

## 2018-02-11 DIAGNOSIS — Z794 Long term (current) use of insulin: Secondary | ICD-10-CM | POA: Diagnosis not present

## 2018-02-11 DIAGNOSIS — I10 Essential (primary) hypertension: Secondary | ICD-10-CM | POA: Diagnosis not present

## 2018-02-11 DIAGNOSIS — E785 Hyperlipidemia, unspecified: Secondary | ICD-10-CM | POA: Diagnosis not present

## 2018-02-11 DIAGNOSIS — E039 Hypothyroidism, unspecified: Secondary | ICD-10-CM | POA: Diagnosis not present

## 2018-02-11 DIAGNOSIS — E119 Type 2 diabetes mellitus without complications: Secondary | ICD-10-CM | POA: Diagnosis not present

## 2018-02-18 DIAGNOSIS — E039 Hypothyroidism, unspecified: Secondary | ICD-10-CM | POA: Diagnosis not present

## 2018-02-18 DIAGNOSIS — Z794 Long term (current) use of insulin: Secondary | ICD-10-CM | POA: Diagnosis not present

## 2018-02-18 DIAGNOSIS — E119 Type 2 diabetes mellitus without complications: Secondary | ICD-10-CM | POA: Diagnosis not present

## 2018-02-18 DIAGNOSIS — M8580 Other specified disorders of bone density and structure, unspecified site: Secondary | ICD-10-CM | POA: Diagnosis not present

## 2018-02-18 DIAGNOSIS — E785 Hyperlipidemia, unspecified: Secondary | ICD-10-CM | POA: Diagnosis not present

## 2018-03-04 DIAGNOSIS — M859 Disorder of bone density and structure, unspecified: Secondary | ICD-10-CM | POA: Diagnosis not present

## 2018-03-04 DIAGNOSIS — Z7983 Long term (current) use of bisphosphonates: Secondary | ICD-10-CM | POA: Diagnosis not present

## 2018-03-04 DIAGNOSIS — Z96659 Presence of unspecified artificial knee joint: Secondary | ICD-10-CM | POA: Diagnosis not present

## 2018-03-04 DIAGNOSIS — Z13820 Encounter for screening for osteoporosis: Secondary | ICD-10-CM | POA: Diagnosis not present

## 2018-08-19 DIAGNOSIS — Z794 Long term (current) use of insulin: Secondary | ICD-10-CM | POA: Diagnosis not present

## 2018-08-19 DIAGNOSIS — E119 Type 2 diabetes mellitus without complications: Secondary | ICD-10-CM | POA: Diagnosis not present

## 2018-08-26 DIAGNOSIS — E119 Type 2 diabetes mellitus without complications: Secondary | ICD-10-CM | POA: Diagnosis not present

## 2018-08-26 DIAGNOSIS — I10 Essential (primary) hypertension: Secondary | ICD-10-CM | POA: Diagnosis not present

## 2018-08-26 DIAGNOSIS — Z1159 Encounter for screening for other viral diseases: Secondary | ICD-10-CM | POA: Diagnosis not present

## 2018-08-26 DIAGNOSIS — Z794 Long term (current) use of insulin: Secondary | ICD-10-CM | POA: Diagnosis not present

## 2019-01-05 DIAGNOSIS — G4733 Obstructive sleep apnea (adult) (pediatric): Secondary | ICD-10-CM | POA: Diagnosis not present

## 2019-02-03 DIAGNOSIS — R7303 Prediabetes: Secondary | ICD-10-CM | POA: Diagnosis not present

## 2019-02-03 DIAGNOSIS — H2513 Age-related nuclear cataract, bilateral: Secondary | ICD-10-CM | POA: Diagnosis not present

## 2019-02-03 DIAGNOSIS — H5203 Hypermetropia, bilateral: Secondary | ICD-10-CM | POA: Diagnosis not present

## 2019-02-17 ENCOUNTER — Encounter: Payer: Self-pay | Admitting: Family Medicine

## 2019-02-17 ENCOUNTER — Ambulatory Visit (INDEPENDENT_AMBULATORY_CARE_PROVIDER_SITE_OTHER): Payer: PPO | Admitting: Family Medicine

## 2019-02-17 ENCOUNTER — Telehealth: Payer: Self-pay | Admitting: Family Medicine

## 2019-02-17 ENCOUNTER — Other Ambulatory Visit: Payer: Self-pay

## 2019-02-17 VITALS — BP 140/80 | HR 76 | Temp 97.2°F | Ht 68.0 in | Wt 225.0 lb

## 2019-02-17 DIAGNOSIS — M545 Low back pain: Secondary | ICD-10-CM

## 2019-02-17 DIAGNOSIS — E039 Hypothyroidism, unspecified: Secondary | ICD-10-CM | POA: Diagnosis not present

## 2019-02-17 DIAGNOSIS — G8929 Other chronic pain: Secondary | ICD-10-CM

## 2019-02-17 DIAGNOSIS — Z125 Encounter for screening for malignant neoplasm of prostate: Secondary | ICD-10-CM

## 2019-02-17 DIAGNOSIS — E119 Type 2 diabetes mellitus without complications: Secondary | ICD-10-CM | POA: Insufficient documentation

## 2019-02-17 DIAGNOSIS — K219 Gastro-esophageal reflux disease without esophagitis: Secondary | ICD-10-CM

## 2019-02-17 DIAGNOSIS — R7303 Prediabetes: Secondary | ICD-10-CM

## 2019-02-17 DIAGNOSIS — I10 Essential (primary) hypertension: Secondary | ICD-10-CM

## 2019-02-17 DIAGNOSIS — E785 Hyperlipidemia, unspecified: Secondary | ICD-10-CM | POA: Diagnosis not present

## 2019-02-17 LAB — LIPID PANEL
Cholesterol: 152 mg/dL (ref 0–200)
HDL: 43.2 mg/dL (ref >39.00)
LDL Cholesterol: 76 mg/dL (ref 0–99)
NonHDL: 108.58
Total CHOL/HDL Ratio: 4
Triglycerides: 161 mg/dL — ABNORMAL HIGH (ref 0.0–149.0)
VLDL: 32.2 mg/dL (ref 0.0–40.0)

## 2019-02-17 LAB — COMPREHENSIVE METABOLIC PANEL
ALT: 25 U/L (ref 0–53)
AST: 21 U/L (ref 0–37)
Albumin: 4.6 g/dL (ref 3.5–5.2)
Alkaline Phosphatase: 58 U/L (ref 39–117)
BUN: 16 mg/dL (ref 6–23)
CO2: 35 mEq/L — ABNORMAL HIGH (ref 19–32)
Calcium: 10 mg/dL (ref 8.4–10.5)
Chloride: 98 mEq/L (ref 96–112)
Creatinine, Ser: 1.02 mg/dL (ref 0.40–1.50)
GFR: 71.03 mL/min (ref >60.00)
Glucose, Bld: 104 mg/dL — ABNORMAL HIGH (ref 70–99)
Potassium: 3.7 mEq/L (ref 3.5–5.1)
Sodium: 141 mEq/L (ref 135–145)
Total Bilirubin: 0.6 mg/dL (ref 0.2–1.2)
Total Protein: 7.5 g/dL (ref 6.0–8.3)

## 2019-02-17 LAB — PSA: PSA: 2.03 ng/mL (ref 0.10–4.00)

## 2019-02-17 LAB — TSH: TSH: 1.3 u[IU]/mL (ref 0.35–4.50)

## 2019-02-17 LAB — HEMOGLOBIN A1C: Hgb A1c MFr Bld: 6.5 % (ref 4.6–6.5)

## 2019-02-17 LAB — T4, FREE: Free T4: 0.95 ng/dL (ref 0.60–1.60)

## 2019-02-17 NOTE — Patient Instructions (Addendum)
I recommend getting the flu shot in mid October. This suggestion would change if the CDC comes out with a different recommendation.   Keep the diet clean and stay active.  Give [place] 2-3 business days to get the results of your labs back.   Heat (pad or rice pillow in microwave) over affected area, 10-15 minutes twice daily.   Let us know if you need anything.  EXERCISES  RANGE OF MOTION (ROM) AND STRETCHING EXERCISES - Low Back Pain Most people with lower back pain will find that their symptoms get worse with excessive bending forward (flexion) or arching at the lower back (extension). The exercises that will help resolve your symptoms will focus on the opposite motion.  If you have pain, numbness or tingling which travels down into your buttocks, leg or foot, the goal of the therapy is for these symptoms to move closer to your back and eventually resolve. Sometimes, these leg symptoms will get better, but your lower back pain may worsen. This is often an indication of progress in your rehabilitation. Be very alert to any changes in your symptoms and the activities in which you participated in the 24 hours prior to the change. Sharing this information with your caregiver will allow him or her to most efficiently treat your condition. These exercises may help you when beginning to rehabilitate your injury. Your symptoms may resolve with or without further involvement from your physician, physical therapist or athletic trainer. While completing these exercises, remember:   Restoring tissue flexibility helps normal motion to return to the joints. This allows healthier, less painful movement and activity.  An effective stretch should be held for at least 30 seconds.  A stretch should never be painful. You should only feel a gentle lengthening or release in the stretched tissue. FLEXION RANGE OF MOTION AND STRETCHING EXERCISES:  STRETCH - Flexion, Single Knee to Chest   Lie on a firm bed or floor  with both legs extended in front of you.  Keeping one leg in contact with the floor, bring your opposite knee to your chest. Hold your leg in place by either grabbing behind your thigh or at your knee.  Pull until you feel a gentle stretch in your low back. Hold 30 seconds.  Slowly release your grasp and repeat the exercise with the opposite side. Repeat 2 times. Complete this exercise 3 times per week.   STRETCH - Flexion, Double Knee to Chest  Lie on a firm bed or floor with both legs extended in front of you.  Keeping one leg in contact with the floor, bring your opposite knee to your chest.  Tense your stomach muscles to support your back and then lift your other knee to your chest. Hold your legs in place by either grabbing behind your thighs or at your knees.  Pull both knees toward your chest until you feel a gentle stretch in your low back. Hold 30 seconds.  Tense your stomach muscles and slowly return one leg at a time to the floor. Repeat 2 times. Complete this exercise 3 times per week.   STRETCH - Low Trunk Rotation  Lie on a firm bed or floor. Keeping your legs in front of you, bend your knees so they are both pointed toward the ceiling and your feet are flat on the floor.  Extend your arms out to the side. This will stabilize your upper body by keeping your shoulders in contact with the floor.  Gently and slowly drop  both knees together to one side until you feel a gentle stretch in your low back. Hold for 30 seconds.  Tense your stomach muscles to support your lower back as you bring your knees back to the starting position. Repeat the exercise to the other side. Repeat 2 times. Complete this exercise at least 3 times per week.   EXTENSION RANGE OF MOTION AND FLEXIBILITY EXERCISES:  STRETCH - Extension, Prone on Elbows   Lie on your stomach on the floor, a bed will be too soft. Place your palms about shoulder width apart and at the height of your head.  Place  your elbows under your shoulders. If this is too painful, stack pillows under your chest.  Allow your body to relax so that your hips drop lower and make contact more completely with the floor.  Hold this position for 30 seconds.  Slowly return to lying flat on the floor. Repeat 2 times. Complete this exercise 3 times per week.   RANGE OF MOTION - Extension, Prone Press Ups  Lie on your stomach on the floor, a bed will be too soft. Place your palms about shoulder width apart and at the height of your head.  Keeping your back as relaxed as possible, slowly straighten your elbows while keeping your hips on the floor. You may adjust the placement of your hands to maximize your comfort. As you gain motion, your hands will come more underneath your shoulders.  Hold this position 30 seconds.  Slowly return to lying flat on the floor. Repeat 2 times. Complete this exercise 3 times per week.   RANGE OF MOTION- Quadruped, Neutral Spine   Assume a hands and knees position on a firm surface. Keep your hands under your shoulders and your knees under your hips. You may place padding under your knees for comfort.  Drop your head and point your tailbone toward the ground below you. This will round out your lower back like an angry cat. Hold this position for 30 seconds.  Slowly lift your head and release your tail bone so that your back sags into a large arch, like an old horse.  Hold this position for 30 seconds.  Repeat this until you feel limber in your low back.  Now, find your "sweet spot." This will be the most comfortable position somewhere between the two previous positions. This is your neutral spine. Once you have found this position, tense your stomach muscles to support your low back.  Hold this position for 30 seconds. Repeat 2 times. Complete this exercise 3 times per week.   STRENGTHENING EXERCISES - Low Back Sprain These exercises may help you when beginning to rehabilitate  your injury. These exercises should be done near your "sweet spot." This is the neutral, low-back arch, somewhere between fully rounded and fully arched, that is your least painful position. When performed in this safe range of motion, these exercises can be used for people who have either a flexion or extension based injury. These exercises may resolve your symptoms with or without further involvement from your physician, physical therapist or athletic trainer. While completing these exercises, remember:   Muscles can gain both the endurance and the strength needed for everyday activities through controlled exercises.  Complete these exercises as instructed by your physician, physical therapist or athletic trainer. Increase the resistance and repetitions only as guided.  You may experience muscle soreness or fatigue, but the pain or discomfort you are trying to eliminate should never worsen during  these exercises. If this pain does worsen, stop and make certain you are following the directions exactly. If the pain is still present after adjustments, discontinue the exercise until you can discuss the trouble with your caregiver.  STRENGTHENING - Deep Abdominals, Pelvic Tilt   Lie on a firm bed or floor. Keeping your legs in front of you, bend your knees so they are both pointed toward the ceiling and your feet are flat on the floor.  Tense your lower abdominal muscles to press your low back into the floor. This motion will rotate your pelvis so that your tail bone is scooping upwards rather than pointing at your feet or into the floor. With a gentle tension and even breathing, hold this position for 3 seconds. Repeat 2 times. Complete this exercise 3 times per week.   STRENGTHENING - Abdominals, Crunches   Lie on a firm bed or floor. Keeping your legs in front of you, bend your knees so they are both pointed toward the ceiling and your feet are flat on the floor. Cross your arms over your  chest.  Slightly tip your chin down without bending your neck.  Tense your abdominals and slowly lift your trunk high enough to just clear your shoulder blades. Lifting higher can put excessive stress on the lower back and does not further strengthen your abdominal muscles.  Control your return to the starting position. Repeat 2 times. Complete this exercise 3 times per week.   STRENGTHENING - Quadruped, Opposite UE/LE Lift   Assume a hands and knees position on a firm surface. Keep your hands under your shoulders and your knees under your hips. You may place padding under your knees for comfort.  Find your neutral spine and gently tense your abdominal muscles so that you can maintain this position. Your shoulders and hips should form a rectangle that is parallel with the floor and is not twisted.  Keeping your trunk steady, lift your right hand no higher than your shoulder and then your left leg no higher than your hip. Make sure you are not holding your breath. Hold this position for 30 seconds.  Continuing to keep your abdominal muscles tense and your back steady, slowly return to your starting position. Repeat with the opposite arm and leg. Repeat 2 times. Complete this exercise 3 times per week.   STRENGTHENING - Abdominals and Quadriceps, Straight Leg Raise   Lie on a firm bed or floor with both legs extended in front of you.  Keeping one leg in contact with the floor, bend the other knee so that your foot can rest flat on the floor.  Find your neutral spine, and tense your abdominal muscles to maintain your spinal position throughout the exercise.  Slowly lift your straight leg off the floor about 6 inches for a count of 3, making sure to not hold your breath.  Still keeping your neutral spine, slowly lower your leg all the way to the floor. Repeat this exercise with each leg 2 times. Complete this exercise 3 times per week.  POSTURE AND BODY MECHANICS CONSIDERATIONS - Low  Back Sprain Keeping correct posture when sitting, standing or completing your activities will reduce the stress put on different body tissues, allowing injured tissues a chance to heal and limiting painful experiences. The following are general guidelines for improved posture.  While reading these guidelines, remember:  The exercises prescribed by your provider will help you have the flexibility and strength to maintain correct postures.  The  correct posture provides the best environment for your joints to work. All of your joints have less wear and tear when properly supported by a spine with good posture. This means you will experience a healthier, less painful body.  Correct posture must be practiced with all of your activities, especially prolonged sitting and standing. Correct posture is as important when doing repetitive low-stress activities (typing) as it is when doing a single heavy-load activity (lifting).  RESTING POSITIONS Consider which positions are most painful for you when choosing a resting position. If you have pain with flexion-based activities (sitting, bending, stooping, squatting), choose a position that allows you to rest in a less flexed posture. You would want to avoid curling into a fetal position on your side. If your pain worsens with extension-based activities (prolonged standing, working overhead), avoid resting in an extended position such as sleeping on your stomach. Most people will find more comfort when they rest with their spine in a more neutral position, neither too rounded nor too arched. Lying on a non-sagging bed on your side with a pillow between your knees, or on your back with a pillow under your knees will often provide some relief. Keep in mind, being in any one position for a prolonged period of time, no matter how correct your posture, can still lead to stiffness.  PROPER SITTING POSTURE In order to minimize stress and discomfort on your spine, you must  sit with correct posture. Sitting with good posture should be effortless for a healthy body. Returning to good posture is a gradual process. Many people can work toward this most comfortably by using various supports until they have the flexibility and strength to maintain this posture on their own. When sitting with proper posture, your ears will fall over your shoulders and your shoulders will fall over your hips. You should use the back of the chair to support your upper back. Your lower back will be in a neutral position, just slightly arched. You may place a small pillow or folded towel at the base of your lower back for  support.  When working at a desk, create an environment that supports good, upright posture. Without extra support, muscles tire, which leads to excessive strain on joints and other tissues. Keep these recommendations in mind:  CHAIR:  A chair should be able to slide under your desk when your back makes contact with the back of the chair. This allows you to work closely.  The chair's height should allow your eyes to be level with the upper part of your monitor and your hands to be slightly lower than your elbows.  BODY POSITION  Your feet should make contact with the floor. If this is not possible, use a foot rest.  Keep your ears over your shoulders. This will reduce stress on your neck and low back.  INCORRECT SITTING POSTURES  If you are feeling tired and unable to assume a healthy sitting posture, do not slouch or slump. This puts excessive strain on your back tissues, causing more damage and pain. Healthier options include:  Using more support, like a lumbar pillow.  Switching tasks to something that requires you to be upright or walking.  Talking a brief walk.  Lying down to rest in a neutral-spine position.  PROLONGED STANDING WHILE SLIGHTLY LEANING FORWARD  When completing a task that requires you to lean forward while standing in one place for a long time,  place either foot up on a stationary 2-4 inch  high object to help maintain the best posture. When both feet are on the ground, the lower back tends to lose its slight inward curve. If this curve flattens (or becomes too large), then the back and your other joints will experience too much stress, tire more quickly, and can cause pain.  CORRECT STANDING POSTURES Proper standing posture should be assumed with all daily activities, even if they only take a few moments, like when brushing your teeth. As in sitting, your ears should fall over your shoulders and your shoulders should fall over your hips. You should keep a slight tension in your abdominal muscles to brace your spine. Your tailbone should point down to the ground, not behind your body, resulting in an over-extended swayback posture.   INCORRECT STANDING POSTURES  Common incorrect standing postures include a forward head, locked knees and/or an excessive swayback. WALKING Walk with an upright posture. Your ears, shoulders and hips should all line-up.  PROLONGED ACTIVITY IN A FLEXED POSITION When completing a task that requires you to bend forward at your waist or lean over a low surface, try to find a way to stabilize 3 out of 4 of your limbs. You can place a hand or elbow on your thigh or rest a knee on the surface you are reaching across. This will provide you more stability, so that your muscles do not tire as quickly. By keeping your knees relaxed, or slightly bent, you will also reduce stress across your lower back. CORRECT LIFTING TECHNIQUES  DO :  Assume a wide stance. This will provide you more stability and the opportunity to get as close as possible to the object which you are lifting.  Tense your abdominals to brace your spine. Bend at the knees and hips. Keeping your back locked in a neutral-spine position, lift using your leg muscles. Lift with your legs, keeping your back straight.  Test the weight of unknown objects before  attempting to lift them.  Try to keep your elbows locked down at your sides in order get the best strength from your shoulders when carrying an object.     Always ask for help when lifting heavy or awkward objects. INCORRECT LIFTING TECHNIQUES DO NOT:   Lock your knees when lifting, even if it is a small object.  Bend and twist. Pivot at your feet or move your feet when needing to change directions.  Assume that you can safely pick up even a paperclip without proper posture.

## 2019-02-17 NOTE — Progress Notes (Signed)
Chief Complaint  Patient presents with  . New Patient (Initial Visit)    establish care, would like to check PSA       New Patient Visit SUBJECTIVE: HPI: Brett Armstrong is an 78 y.o.male who is being seen for establishing care.  The patient was previously seen at [facility].   Pt w hx of prediabetes. He is on Metformin 500 mg XR. Diet is healthy.  He walks and does some weight resistance training.  Hypertension Patient presents for hypertension follow up. He is compliant with medications-Hyzaar 100-25 mg daily, Coreg 6.25 mg twice daily. Patient has these side effects of medication: none He is adhering to a healthy diet overall. Exercise: walking  Hyperlipidemia Patient presents for dyslipidemia follow up. Currently being treated with pravastatin 40 mg/d and compliance with treatment thus far has been good. He denies myalgias. He is adhering to a healthy diet. Exercise: walking The patient is not known to have coexisting coronary artery disease.  Hypothyroidism Patient presents for follow-up of hypothyroidism.  Reports compliance with medication- 88 mcg/d levothyroxine. Current symptoms include: denies fatigue, weight changes, heat/cold intolerance, bowel/skin changes or CVS symptoms  GERD History of reflux, will get burning with certain meals.  He will take omeprazole intermittently, usually averaging to once per week.  Symptoms are generally well controlled.  Left lower back pain over the past 6 months.  No injury or change in activity.  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  He will intermittently have issues when he gets out of the car.  No bruising, swelling, weakness.  He does not stretch routinely at home.  Allergies  Allergen Reactions  . Methocarbamol [Methocarbamol] Nausea And Vomiting    Past Medical History:  Diagnosis Date  . Arthritis    KNEES, HANDS   . Chronic kidney disease    HX OF KIDNEY STONES   . ED (erectile  dysfunction)   . GERD (gastroesophageal reflux disease)   . History of DVT (deep vein thrombosis) 2016   Provoked by surgery; moved to pulm  . Hyperlipidemia   . Hypertension   . Hypothyroid   . Nephrolithiasis   . OSA (obstructive sleep apnea)   . Peripheral vascular disease ([place])    left leg circulaton problem per pt   . Prediabetes   . Rosacea   . Vitamin B 12 deficiency    Past Surgical History:  Procedure Laterality Date  . APPENDECTOMY    . HERNIA REPAIR     UMBILICAL   . LITHOTRIPSY     X 4  . OTHER SURGICAL HISTORY     TUMOR REMOVED RIGHT FACE - BENIGN   . OTHER SURGICAL HISTORY     small facial tumor removal 1970s  . TOTAL KNEE ARTHROPLASTY  06/24/2011   Procedure: TOTAL KNEE ARTHROPLASTY;  Surgeon: Mauri Pole;  Location: [facility];  Service: Orthopedics;  Laterality: Right;  . TOTAL KNEE ARTHROPLASTY  09/03/2011   Procedure: TOTAL KNEE ARTHROPLASTY;  Surgeon: Mauri Pole, MD;  Location: [facility];  Service: Orthopedics;  Laterality: Left;   Family History  Problem Relation Age of Onset  . Alzheimer's disease Mother   . CAD Mother   . Heart disease Mother   . Lung disease Father 23       Pulmonary Embolus  . Diabetes Maternal Grandfather   . Heart disease Paternal Grandfather   . Thyroid disease Daughter    Allergies  Allergen Reactions  .  Methocarbamol [Methocarbamol] Nausea And Vomiting    Current Outpatient Medications:  .  aspirin EC 325 MG tablet, Take 1 tablet (325 mg total) by mouth 2 (two) times daily., Disp: 60 tablet, Rfl:  .  levothyroxine (SYNTHROID, LEVOTHROID) 88 MCG tablet, Take 88 mcg by mouth daily before breakfast., Disp: , Rfl:  .  losartan-hydrochlorothiazide (HYZAAR) 100-25 MG tablet, Take 1 tablet by mouth every morning., Disp: , Rfl:  .  vitamin B-12 (CYANOCOBALAMIN) 1000 MCG tablet, Take 1,000 mcg by mouth daily. , Disp: , Rfl:  .  carvedilol (COREG) 6.25 MG tablet, Take 1 tablet (6.25 mg total) by mouth 2 (two) times daily with a  meal., Disp: 180 tablet, Rfl: 3 .  metFORMIN (GLUCOPHAGE-XR) 500 MG 24 hr tablet, Take 2 tablets (1,000 mg total) by mouth every evening., Disp: 60 tablet, Rfl: 5 .  omeprazole (PRILOSEC) 20 MG capsule, Take 1 capsule (20 mg total) by mouth 2 (two) times daily. (Patient taking differently: Take 20 mg by mouth 2 (two) times daily as needed. ), Disp: 180 capsule, Rfl: 3 .  pravastatin (PRAVACHOL) 40 MG tablet, Take 1 tablet (40 mg total) by mouth at bedtime., Disp: 90 tablet, Rfl: 3  ROS Const: no fevers Psych: +forgetfullness Neuro: No weakness MSK: +L sided back pain Lungs: No SOB Endo: No wt changes Cardiac: No CP Skin: No rashes GI: No bleeding GU: No pain Eyes: +cataracts  OBJECTIVE: BP 140/80 (BP Location: Left Arm, Patient Position: Sitting, Cuff Size: Large)   Pulse 76   Temp (!) 97.2 F (36.2 C) (Temporal)   Ht 5\' 8"  (1.727 m)   Wt 225 lb (102.1 kg)   SpO2 97%   BMI 34.21 kg/m   Constitutional: -  VS reviewed -  Well developed, well nourished, appears stated age -  No apparent distress  Psychiatric: -  Oriented to person, place, and time -  Memory intact -  Affect and mood normal -  Fluent conversation, good eye contact -  Judgment and insight age appropriate  Eye: -  Conjunctivae clear, no discharge -  Pupils symmetric, round, reactive to light  ENMT: -  MMM    Pharynx moist, no exudate, no erythema  Neck: -  No gross swelling, no palpable masses -  Thyroid midline, not enlarged, mobile, no palpable masses  Cardiovascular: -  RRR -  No bruits -  No LE edema  Respiratory: -  Normal respiratory effort, no accessory muscle use, no retraction -  Breath sounds equal, no wheezes, no ronchi, no crackles  Gastrointestinal: -  Bowel sounds normal -  No tenderness, no distention, no guarding, no masses  Neurological:  -  CN II - XII grossly intact -  Sensation grossly intact to light touch, equal bilaterally  Musculoskeletal: -  No clubbing, no cyanosis -  No TTP  over the lumbar spine; points to L ES group near L2-4 -  Gait normal  Skin: -  No significant lesion on inspection -  Warm and dry to palpation   ASSESSMENT/PLAN: Essential hypertension, benign - Plan: Comprehensive metabolic panel; continue blood pressure medications, goal blood pressure is less than 150/90  Hypothyroidism, unspecified type - Plan: TSH, T4, free; continue 88 mcg Synthroid  Gastroesophageal reflux disease, esophagitis presence not specified; continue intermittent omeprazole  Hyperlipidemia, unspecified hyperlipidemia type - Plan: Lipid panel; continue pravastatin 40 mg daily  Prediabetes - Plan: Hemoglobin A1c; continue metformin  Screening for prostate cancer - Plan: PSA; counseled on risks and benefits of screening  for prostate cancer with PSA including recommended screening discontinuation; he did wish to proceed despite this.  Chronic low back pain, L, w/o sciatica Stretches/exercises, heat.   Orders as above. Flu shot rec'd for mid Oct. Patient should return in 6 mo for CPE or prn. The patient voiced understanding and agreement to the plan.   [place], DO 02/17/19  10:42 AM

## 2019-02-17 NOTE — Telephone Encounter (Signed)
Pt dropped off immunization inf at the front desk, 4 pages. Document put at front office tray under providers name.

## 2019-02-17 NOTE — Telephone Encounter (Signed)
Noted/will update once we get this record.

## 2019-02-25 ENCOUNTER — Ambulatory Visit (INDEPENDENT_AMBULATORY_CARE_PROVIDER_SITE_OTHER): Payer: PPO

## 2019-02-25 ENCOUNTER — Encounter: Payer: Self-pay | Admitting: Family Medicine

## 2019-02-25 ENCOUNTER — Other Ambulatory Visit: Payer: Self-pay

## 2019-02-25 DIAGNOSIS — Z23 Encounter for immunization: Secondary | ICD-10-CM | POA: Diagnosis not present

## 2019-04-26 ENCOUNTER — Other Ambulatory Visit: Payer: Self-pay | Admitting: Family Medicine

## 2019-04-26 NOTE — Telephone Encounter (Signed)
Medication Refill - Medication:  levothyroxine (SYNTHROID, LEVOTHROID) 88 MCG tablet  Has the patient contacted their pharmacy? Yes advised to call office. Pt would like a 90 day refill.   Preferred Pharmacy (with phone number or street name):  [facility], [place] [phone] (Phone) [phone] (Fax)   Agent: Please be advised that RX refills may take up to 3 business days. We ask that you follow-up with your pharmacy.

## 2019-04-27 MED ORDER — LEVOTHYROXINE SODIUM 88 MCG PO TABS: 88.0000 ug | ORAL_TABLET | Freq: Every day | ORAL | 1 refills | Status: DC

## 2019-05-07 ENCOUNTER — Other Ambulatory Visit: Payer: Self-pay | Admitting: Family Medicine

## 2019-05-07 DIAGNOSIS — R7301 Impaired fasting glucose: Secondary | ICD-10-CM

## 2019-05-07 NOTE — Telephone Encounter (Signed)
Copied from [place] [phone]. Topic: Quick Communication - Rx Refill/Question >> May 07, 2019  4:13 PM Keene Breath wrote: Medication: metFORMIN (GLUCOPHAGE-XR) 500 MG 24 hr tablet and pravastatin (PRAVACHOL) 40 MG tablet    Preferred Pharmacy (with phone number or street name): [facility] - [place], [address] [phone] (Phone) [phone] (Fax)    Agent: Please be advised that RX refills may take up to 3 business days. We ask that you follow-up with your pharmacy.

## 2019-05-10 ENCOUNTER — Other Ambulatory Visit: Payer: Self-pay

## 2019-05-25 NOTE — Telephone Encounter (Signed)
Patient checking on the status of 90 day supply for metFORMIN (GLUCOPHAGE-XR) 500 MG 24 hr tablet , request was sent in on 05/07/2019 please advise   [place], [place] [address]

## 2019-05-26 ENCOUNTER — Telehealth: Payer: Self-pay | Admitting: Family Medicine

## 2019-05-26 DIAGNOSIS — E785 Hyperlipidemia, unspecified: Secondary | ICD-10-CM

## 2019-05-26 MED ORDER — PRAVASTATIN SODIUM 40 MG PO TABS: 40.0000 mg | ORAL_TABLET | Freq: Every day | ORAL | 3 refills | Status: DC

## 2019-05-26 NOTE — Telephone Encounter (Signed)
Medication Refill - Medication:  pravastatin (PRAVACHOL) 40 MG tablet   Has the patient contacted their pharmacy?  Yes advised to call office.  Preferred Pharmacy (with phone number or street name):  [facility], [address] [phone] (Phone) [phone] (Fax)   Agent: Please be advised that RX refills may take up to 3 business days. We ask that you follow-up with your pharmacy.

## 2019-05-26 NOTE — Telephone Encounter (Signed)
Refill done.  

## 2019-05-31 ENCOUNTER — Other Ambulatory Visit: Payer: Self-pay | Admitting: Family Medicine

## 2019-05-31 DIAGNOSIS — R7301 Impaired fasting glucose: Secondary | ICD-10-CM

## 2019-05-31 MED ORDER — METFORMIN HCL ER 500 MG PO TB24: 1000.0000 mg | ORAL_TABLET | Freq: Every evening | ORAL | 0 refills | Status: DC

## 2019-06-24 DIAGNOSIS — H25812 Combined forms of age-related cataract, left eye: Secondary | ICD-10-CM | POA: Diagnosis not present

## 2019-06-24 DIAGNOSIS — E119 Type 2 diabetes mellitus without complications: Secondary | ICD-10-CM | POA: Diagnosis not present

## 2019-06-24 DIAGNOSIS — H43811 Vitreous degeneration, right eye: Secondary | ICD-10-CM | POA: Diagnosis not present

## 2019-06-24 DIAGNOSIS — H2511 Age-related nuclear cataract, right eye: Secondary | ICD-10-CM | POA: Diagnosis not present

## 2019-07-01 DIAGNOSIS — E119 Type 2 diabetes mellitus without complications: Secondary | ICD-10-CM | POA: Diagnosis not present

## 2019-07-01 DIAGNOSIS — Z01818 Encounter for other preprocedural examination: Secondary | ICD-10-CM | POA: Diagnosis not present

## 2019-07-01 DIAGNOSIS — H25812 Combined forms of age-related cataract, left eye: Secondary | ICD-10-CM | POA: Diagnosis not present

## 2019-07-01 DIAGNOSIS — H2511 Age-related nuclear cataract, right eye: Secondary | ICD-10-CM | POA: Diagnosis not present

## 2019-07-19 HISTORY — PX: EYE SURGERY: SHX253

## 2019-07-23 ENCOUNTER — Other Ambulatory Visit: Payer: Self-pay | Admitting: Family Medicine

## 2019-07-30 DIAGNOSIS — H2512 Age-related nuclear cataract, left eye: Secondary | ICD-10-CM | POA: Diagnosis not present

## 2019-07-30 DIAGNOSIS — H25812 Combined forms of age-related cataract, left eye: Secondary | ICD-10-CM | POA: Diagnosis not present

## 2019-08-23 NOTE — Progress Notes (Signed)
Nurse c.onnected with patient 08/24/19 at  3:15 PM EST by a telephone enabled telemedicine application and verified that I am speaking with the correct person using two identifiers. Patient stated full name and DOB. Patient gave permission to continue with virtual visit. Patient's location was at home and Nurse's location was at [place] office.   Subjective:   Brett Armstrong is a 79 y.o. male who presents for an Initial Medicare Annual Wellness Visit.  Works part time driving a Printmaker.  Review of Systems  No ROS.  Medicare Wellness Virtual Visit.  Visual/audio telehealth visit, UTA vital signs.   See social history for additional risk factors.  Home Safety/Smoke Alarms: Feels safe in home. Smoke alarms in place.  Lives w/ wife. Wears CPAP.  Male:   CCS-last 07/21/09. normal     PSA-  Lab Results  Component Value Date   PSA 2.03 02/17/2019      Objective:     Advanced Directives 08/24/2019 09/03/2011 08/27/2011 06/24/2011 06/19/2011  Does Patient Have a Medical Advance Directive? Yes Patient has advance directive, copy not in chart Patient has advance directive, copy in chart Patient has advance directive, copy not in chart Patient does not have advance directive;Patient has advance directive, copy not in chart  Type of Advance Directive [place];Living will - [place];Living will Healthcare Power of [place]  Does patient want to make changes to medical advance directive? Yes (MAU/Ambulatory/Procedural Areas - Information given) - - - -  Copy of [place] in Chart? Yes - validated most recent copy scanned in chart (See row information) - - - -  Pre-existing out of facility DNR order (yellow form or pink MOST form) - No No No No    Current Medications (verified) Outpatient Encounter Medications as of 08/24/2019  Medication Sig  . aspirin EC 325 MG tablet Take 1 tablet (325 mg total) by mouth 2 (two)  times daily. (Patient taking differently: Take 325 mg by mouth once. )  . levothyroxine (SYNTHROID) 88 MCG tablet TAKE ONE TABLET BY MOUTH DAILY BEFORE BREAKFAST  . losartan-hydrochlorothiazide (HYZAAR) 100-25 MG tablet Take 1 tablet by mouth every morning.  . metFORMIN (GLUCOPHAGE-XR) 500 MG 24 hr tablet Take 2 tablets (1,000 mg total) by mouth every evening.  . pravastatin (PRAVACHOL) 40 MG tablet Take 1 tablet (40 mg total) by mouth at bedtime.  . vitamin B-12 (CYANOCOBALAMIN) 1000 MCG tablet Take 1,000 mcg by mouth daily.   . carvedilol (COREG) 6.25 MG tablet Take 1 tablet (6.25 mg total) by mouth 2 (two) times daily with a meal.  . omeprazole (PRILOSEC) 20 MG capsule Take 1 capsule (20 mg total) by mouth 2 (two) times daily. (Patient taking differently: Take 20 mg by mouth 2 (two) times daily as needed. )   No facility-administered encounter medications on file as of 08/24/2019.    Allergies (verified) Methocarbamol [methocarbamol]   History: Past Medical History:  Diagnosis Date  . Arthritis    KNEES, HANDS   . Chronic kidney disease    HX OF KIDNEY STONES   . Diabetes mellitus ([place])   . ED (erectile dysfunction)   . GERD (gastroesophageal reflux disease)   . History of DVT (deep vein thrombosis) 2016   Provoked by surgery; moved to pulm  . Hyperlipidemia   . Hypertension   . Hypothyroid   . Nephrolithiasis   . OSA (obstructive sleep apnea)   . Peripheral vascular disease ([place])  left leg circulaton problem per pt   . Rosacea   . Vitamin B 12 deficiency    Past Surgical History:  Procedure Laterality Date  . APPENDECTOMY    . EYE SURGERY Left 07/19/2019   Cataract removal  . HERNIA REPAIR     UMBILICAL   . LITHOTRIPSY     X 4  . OTHER SURGICAL HISTORY     TUMOR REMOVED RIGHT FACE - BENIGN   . OTHER SURGICAL HISTORY     small facial tumor removal 1970s  . TOTAL KNEE ARTHROPLASTY  06/24/2011   Procedure: TOTAL KNEE ARTHROPLASTY;  Surgeon: Mauri Pole;  Location:  [facility];  Service: Orthopedics;  Laterality: Right;  . TOTAL KNEE ARTHROPLASTY  09/03/2011   Procedure: TOTAL KNEE ARTHROPLASTY;  Surgeon: Mauri Pole, MD;  Location: [facility];  Service: Orthopedics;  Laterality: Left;   Family History  Problem Relation Age of Onset  . Alzheimer's disease Mother   . CAD Mother   . Heart disease Mother   . Lung disease Father 29       Pulmonary Embolus  . Diabetes Maternal Grandfather   . Heart disease Paternal Grandfather   . Thyroid disease Daughter    Social History   Socioeconomic History  . Marital status: Married    Spouse name: Romie Minus  . Number of children: 2  . Years of education: 65  . Highest education level: Not on file  Occupational History  . Not on file  Tobacco Use  . Smoking status: Never Smoker  . Smokeless tobacco: Never Used  Substance and Sexual Activity  . Alcohol use: No  . Drug use: No  . Sexual activity: Yes    Partners: Female  Other Topics Concern  . Not on file  Social History Narrative   Marital Status: Married Romie Minus)    Children:  Daughters Caren Griffins, Santiago Glad)    Pets:  Dog (1)    Living Situation: Lives with spouse.   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 Difficulty of Paying Living Expenses: Not on file  Food Insecurity:   . Worried About Charity fundraiser in the Last Year: Not on file  . Ran Out of Food in the Last Year: Not on file  Transportation Needs:   . Lack of Transportation (Medical): Not on file  . Lack of Transportation (Non-Medical): Not on file  Physical Activity:   . Days of Exercise per Week: Not on file  . Minutes of Exercise per Session: Not on file  Stress:   . Feeling of Stress : Not on file  Social Connections:   . Frequency  of Communication with Friends and Family: Not on file  . Frequency of Social Gatherings with Friends and Family: Not on file  . Attends Religious Services: Not on file  . Active Member of Clubs or Organizations: Not on file  . Attends Archivist Meetings: Not on file  . Marital Status: Not on file   Tobacco Counseling Counseling given: Not Answered   Clinical Intake: Pain : No/denies pain    Activities of Daily Living In your present state of health,  do you have any difficulty performing the following activities: 08/24/2019  Hearing? Y  Comment Does not want hearing aids.  Vision? N  Difficulty concentrating or making decisions? N  Walking or climbing stairs? N  Dressing or bathing? N  Doing errands, shopping? N  Preparing Food and eating ? N  Using the Toilet? N  In the past six months, have you accidently leaked urine? N  Do you have problems with loss of bowel control? N  Managing your Medications? N  Managing your Finances? N  Housekeeping or managing your Housekeeping? N  Some recent data might be hidden     Immunizations and Health Maintenance Immunization History  Administered Date(s) Administered  . Fluad Quad(high Dose 65+) 02/25/2019  . Influenza, High Dose Seasonal PF 02/23/2013, 03/07/2015, 02/28/2016, 02/26/2017, 03/04/2018  . Influenza,inj,Quad PF,6+ Mos 02/23/2013, 03/07/2015  . Influenza-Unspecified 02/23/2013, 02/28/2016  . Pneumococcal Conjugate-13 06/01/2009, 04/22/2014, 04/22/2014  . Pneumococcal Polysaccharide-23 06/01/2009, 06/01/2009  . Td 02/26/2005  . Tdap 02/26/2005, 12/30/2011  . Zoster 06/01/2009  . Zoster Recombinat (Shingrix) 08/29/2017, 12/31/2017   Health Maintenance Due  Topic Date Due  . FOOT EXAM  03/22/1951  . OPHTHALMOLOGY EXAM  03/22/1951  . HEMOGLOBIN A1C  08/17/2019    Patient Care Team: [name], DO as PCP - General (Family Medicine)  Indicate any recent Medical Services you may have received  from other than [facility] providers in the past year (date may be approximate).    Assessment:   This is a routine wellness examination for [place]. Physical assessment deferred to PCP.  Hearing/Vision screen Unable to assess. This visit is enabled though telemedicine due to Covid 19.   Dietary issues and exercise activities discussed: Current Exercise Habits: Home exercise routine, Type of exercise: calisthenics, Time (Minutes): 10, Frequency (Times/Week): 5, Weekly Exercise (Minutes/Week): 50, Intensity: Mild, Exercise limited by: None identified   Diet (meal preparation, eat out, water intake, caffeinated beverages, dairy products, fruits and vegetables): well balanced  Goals    . DIET - REDUCE SUGAR INTAKE      Depression Screen PHQ 2/9 Scores 08/24/2019 02/23/2013  PHQ - 2 Score 0 0    Fall Risk Fall Risk  08/24/2019 02/23/2013  Falls in the past year? 0 No  Number falls in past yr: 0 -  Injury with Fall? 0 -  Follow up Education provided;Falls prevention discussed -     Cognitive Function: Ad8 score reviewed for issues:  Issues making decisions:no  Less interest in hobbies / activities:no  Repeats questions, stories (family complaining):no  Trouble using ordinary gadgets (microwave, computer, phone):no  Forgets the month or year: no  Mismanaging finances: no  Remembering appts: no  Daily problems with thinking and/or memory:no Ad8 score is=0         Screening Tests Health Maintenance  Topic Date Due  . FOOT EXAM  03/22/1951  . OPHTHALMOLOGY EXAM  03/22/1951  . HEMOGLOBIN A1C  08/17/2019  . TETANUS/TDAP  12/29/2021  . INFLUENZA VACCINE  Completed  . PNA vac Low Risk Adult  Completed       Plan:    Please schedule your next medicare wellness visit with me in 1 yr.  Continue to eat heart healthy diet (full of fruits, vegetables, whole grains, lean protein, water--limit salt, fat, and sugar intake) and increase physical activity as tolerated.  Continue doing  brain stimulating activities (puzzles, reading, adult coloring books, staying active) to keep memory sharp.     I have personally reviewed and noted  the following in the patient's chart:   . Medical and social history . Use of alcohol, tobacco or illicit drugs  . Current medications and supplements . Functional ability and status . Nutritional status . Physical activity . Advanced directives . List of other physicians . Hospitalizations, surgeries, and ER visits in previous 12 months . Vitals . Screenings to include cognitive, depression, and falls . Referrals and appointments  In addition, I have reviewed and discussed with patient certain preventive protocols, quality metrics, and best practice recommendations. A written personalized care plan for preventive services as well as general preventive health recommendations were provided to patient.     Shela Nevin, [place]   08/24/2019

## 2019-08-24 ENCOUNTER — Ambulatory Visit (INDEPENDENT_AMBULATORY_CARE_PROVIDER_SITE_OTHER): Payer: PPO | Admitting: *Deleted

## 2019-08-24 ENCOUNTER — Encounter: Payer: Self-pay | Admitting: *Deleted

## 2019-08-24 ENCOUNTER — Other Ambulatory Visit: Payer: Self-pay

## 2019-08-24 DIAGNOSIS — Z0000 Encounter for general adult medical examination without abnormal findings: Secondary | ICD-10-CM

## 2019-08-24 NOTE — Patient Instructions (Signed)
Please schedule your next medicare wellness visit with me in 1 yr.  Continue to eat heart healthy diet (full of fruits, vegetables, whole grains, lean protein, water--limit salt, fat, and sugar intake) and increase physical activity as tolerated.  Continue doing brain stimulating activities (puzzles, reading, adult coloring books, staying active) to keep memory sharp.    Brett Armstrong , Thank you for taking time to come for your Medicare Wellness Visit. I appreciate your ongoing commitment to your health goals. Please review the following plan we discussed and let me know if I can assist you in the future.   These are the goals we discussed: Goals    . DIET - REDUCE SUGAR INTAKE       This is a list of the screening recommended for you and due dates:  Health Maintenance  Topic Date Due  . Complete foot exam   03/22/1951  . Eye exam for diabetics  03/22/1951  . Hemoglobin A1C  08/17/2019  . Tetanus Vaccine  12/29/2021  . Flu Shot  Completed  . Pneumonia vaccines  Completed    Preventive Care 77 Years and Older, Male Preventive care refers to lifestyle choices and visits with your health care provider that can promote health and wellness. This includes:  A yearly physical exam. This is also called an annual well check.  Regular dental and eye exams.  Immunizations.  Screening for certain conditions.  Healthy lifestyle choices, such as diet and exercise. What can I expect for my preventive care visit? Physical exam Your health care provider will check:  Height and weight. These may be used to calculate body mass index (BMI), which is a measurement that tells if you are at a healthy weight.  Heart rate and blood pressure.  Your skin for abnormal spots. Counseling Your health care provider may ask you questions about:  Alcohol, tobacco, and drug use.  Emotional well-being.  Home and relationship well-being.  Sexual activity.  Eating habits.  History of falls.   Memory and ability to understand (cognition).  Work and work Statistician. What immunizations do I need?  Influenza (flu) vaccine  This is recommended every year. Tetanus, diphtheria, and pertussis (Tdap) vaccine  You may need a Td booster every 10 years. Varicella (chickenpox) vaccine  You may need this vaccine if you have not already been vaccinated. Zoster (shingles) vaccine  You may need this after age 77. Pneumococcal conjugate (PCV13) vaccine  One dose is recommended after age 77. Pneumococcal polysaccharide (PPSV23) vaccine  One dose is recommended after age 77. Measles, mumps, and rubella (MMR) vaccine  You may need at least one dose of MMR if you were born in 1957 or later. You may also need a second dose. Meningococcal conjugate (MenACWY) vaccine  You may need this if you have certain conditions. Hepatitis A vaccine  You may need this if you have certain conditions or if you travel or work in places where you may be exposed to hepatitis A. Hepatitis B vaccine  You may need this if you have certain conditions or if you travel or work in places where you may be exposed to hepatitis B. Haemophilus influenzae type b (Hib) vaccine  You may need this if you have certain conditions. You may receive vaccines as individual doses or as more than one vaccine together in one shot (combination vaccines). Talk with your health care provider about the risks and benefits of combination vaccines. What tests do I need? Blood tests  Lipid and cholesterol  levels. These may be checked every 5 years, or more frequently depending on your overall health.  Hepatitis C test.  Hepatitis B test. Screening  Lung cancer screening. You may have this screening every year starting at age 77 if you have a 30-pack-year history of smoking and currently smoke or have quit within the past 15 years.  Colorectal cancer screening. All adults should have this screening starting at age 77 and  continuing until age 77. Your health care provider may recommend screening at age 55 if you are at increased risk. You will have tests every 1-10 years, depending on your results and the type of screening test.  Prostate cancer screening. Recommendations will vary depending on your family history and other risks.  Diabetes screening. This is done by checking your blood sugar (glucose) after you have not eaten for a while (fasting). You may have this done every 1-3 years.  Abdominal aortic aneurysm (AAA) screening. You may need this if you are a current or former smoker.  Sexually transmitted disease (STD) testing. Follow these instructions at home: Eating and drinking  Eat a diet that includes fresh fruits and vegetables, whole grains, lean protein, and low-fat dairy products. Limit your intake of foods with high amounts of sugar, saturated fats, and salt.  Take vitamin and mineral supplements as recommended by your health care provider.  Do not drink alcohol if your health care provider tells you not to drink.  If you drink alcohol: ? Limit how much you have to 0-2 drinks a day. ? Be aware of how much alcohol is in your drink. In the [place] one drink equals one 12 oz bottle of beer (355 mL), one 5 oz glass of wine (148 mL), or one 1 oz glass of hard liquor (44 mL). Lifestyle  Take daily care of your teeth and gums.  Stay active. Exercise for at least 30 minutes on 5 or more days each week.  Do not use any products that contain nicotine or tobacco, such as cigarettes, e-cigarettes, and chewing tobacco. If you need help quitting, ask your health care provider.  If you are sexually active, practice safe sex. Use a condom or other form of protection to prevent STIs (sexually transmitted infections).  Talk with your health care provider about taking a low-dose aspirin or statin. What's next?  Visit your health care provider once a year for a well check visit.  Ask your health care  provider how often you should have your eyes and teeth checked.  Stay up to date on all vaccines. This information is not intended to replace advice given to you by your health care provider. Make sure you discuss any questions you have with your health care provider. Document Revised: 05/28/2018 Document Reviewed: 05/28/2018 Elsevier Patient Education  2020 [employer].

## 2019-08-25 ENCOUNTER — Encounter: Payer: Self-pay | Admitting: Family Medicine

## 2019-08-25 ENCOUNTER — Ambulatory Visit (INDEPENDENT_AMBULATORY_CARE_PROVIDER_SITE_OTHER): Payer: PPO | Admitting: Family Medicine

## 2019-08-25 ENCOUNTER — Other Ambulatory Visit: Payer: Self-pay

## 2019-08-25 VITALS — BP 132/84 | HR 71 | Temp 95.9°F | Ht 68.0 in | Wt 229.2 lb

## 2019-08-25 DIAGNOSIS — G4733 Obstructive sleep apnea (adult) (pediatric): Secondary | ICD-10-CM | POA: Diagnosis not present

## 2019-08-25 DIAGNOSIS — Z125 Encounter for screening for malignant neoplasm of prostate: Secondary | ICD-10-CM

## 2019-08-25 DIAGNOSIS — E039 Hypothyroidism, unspecified: Secondary | ICD-10-CM | POA: Diagnosis not present

## 2019-08-25 DIAGNOSIS — Z0000 Encounter for general adult medical examination without abnormal findings: Secondary | ICD-10-CM | POA: Diagnosis not present

## 2019-08-25 DIAGNOSIS — Z9989 Dependence on other enabling machines and devices: Secondary | ICD-10-CM | POA: Diagnosis not present

## 2019-08-25 DIAGNOSIS — E1165 Type 2 diabetes mellitus with hyperglycemia: Secondary | ICD-10-CM

## 2019-08-25 DIAGNOSIS — K219 Gastro-esophageal reflux disease without esophagitis: Secondary | ICD-10-CM

## 2019-08-25 DIAGNOSIS — K439 Ventral hernia without obstruction or gangrene: Secondary | ICD-10-CM

## 2019-08-25 LAB — LIPID PANEL
Cholesterol: 139 mg/dL (ref 0–200)
HDL: 36.8 mg/dL — ABNORMAL LOW (ref >39.00)
LDL Cholesterol: 64 mg/dL (ref 0–99)
NonHDL: 102.47
Total CHOL/HDL Ratio: 4
Triglycerides: 191 mg/dL — ABNORMAL HIGH (ref 0.0–149.0)
VLDL: 38.2 mg/dL (ref 0.0–40.0)

## 2019-08-25 LAB — COMPREHENSIVE METABOLIC PANEL
ALT: 22 U/L (ref 0–53)
AST: 19 U/L (ref 0–37)
Albumin: 4.4 g/dL (ref 3.5–5.2)
Alkaline Phosphatase: 55 U/L (ref 39–117)
BUN: 17 mg/dL (ref 6–23)
CO2: 36 mEq/L — ABNORMAL HIGH (ref 19–32)
Calcium: 9.7 mg/dL (ref 8.4–10.5)
Chloride: 99 mEq/L (ref 96–112)
Creatinine, Ser: 1.07 mg/dL (ref 0.40–1.50)
GFR: 67.12 mL/min (ref >60.00)
Glucose, Bld: 99 mg/dL (ref 70–99)
Potassium: 3.6 mEq/L (ref 3.5–5.1)
Sodium: 139 mEq/L (ref 135–145)
Total Bilirubin: 0.7 mg/dL (ref 0.2–1.2)
Total Protein: 7.2 g/dL (ref 6.0–8.3)

## 2019-08-25 LAB — MICROALBUMIN / CREATININE URINE RATIO
Creatinine,U: 156.4 mg/dL
Microalb Creat Ratio: 0.6 mg/g (ref 0.0–30.0)
Microalb, Ur: 1 mg/dL (ref 0.0–1.9)

## 2019-08-25 LAB — HEMOGLOBIN A1C: Hgb A1c MFr Bld: 6.5 % (ref 4.6–6.5)

## 2019-08-25 LAB — PSA: PSA: 1.79 ng/mL (ref 0.10–4.00)

## 2019-08-25 LAB — TSH: TSH: 2.17 u[IU]/mL (ref 0.35–4.50)

## 2019-08-25 MED ORDER — OMEPRAZOLE 20 MG PO CPDR: 20.0000 mg | DELAYED_RELEASE_CAPSULE | Freq: Two times a day (BID) | ORAL | 2 refills | Status: DC | PRN

## 2019-08-25 NOTE — Patient Instructions (Addendum)
Give us 2-3 business days to get the results of your labs back.   Keep the diet clean and stay active.  If you do not hear anything about your referral in the next 1-2 weeks, call our office and ask for an update.  Let us know if you need anything. 

## 2019-08-25 NOTE — Progress Notes (Signed)
Chief Complaint  Patient presents with  . Annual Exam    Well Male Brett Armstrong is here for a complete physical.   His last physical was >1 year ago.  Current diet: in general, a "healthy" diet.   Current exercise: walking, wt training Weight trend: increased a little Daytime fatigue? No. Seat belt? Yes.    Health maintenance Shingrix- Yes Tetanus- Yes Pneumonia vaccine- Yes  Past Medical History:  Diagnosis Date  . Arthritis    KNEES, HANDS   . Chronic kidney disease    HX OF KIDNEY STONES   . Diabetes mellitus ([place])   . ED (erectile dysfunction)   . GERD (gastroesophageal reflux disease)   . History of DVT (deep vein thrombosis) 2016   Provoked by surgery; moved to pulm  . Hyperlipidemia   . Hypertension   . Hypothyroid   . Nephrolithiasis   . OSA (obstructive sleep apnea)   . Peripheral vascular disease ([place])    left leg circulaton problem per pt   . Rosacea   . Vitamin B 12 deficiency      Past Surgical History:  Procedure Laterality Date  . APPENDECTOMY    . EYE SURGERY Left 07/19/2019   Cataract removal  . HERNIA REPAIR     UMBILICAL   . LITHOTRIPSY     X 4  . OTHER SURGICAL HISTORY     TUMOR REMOVED RIGHT FACE - BENIGN   . OTHER SURGICAL HISTORY     small facial tumor removal 1970s  . TOTAL KNEE ARTHROPLASTY  06/24/2011   Procedure: TOTAL KNEE ARTHROPLASTY;  Surgeon: Mauri Pole;  Location: [facility];  Service: Orthopedics;  Laterality: Right;  . TOTAL KNEE ARTHROPLASTY  09/03/2011   Procedure: TOTAL KNEE ARTHROPLASTY;  Surgeon: Mauri Pole, MD;  Location: [facility];  Service: Orthopedics;  Laterality: Left;    Medications  Current Outpatient Medications on File Prior to Visit  Medication Sig Dispense Refill  . aspirin EC 325 MG tablet Take 1 tablet (325 mg total) by mouth 2 (two) times daily. (Patient taking differently: Take 325 mg by mouth once. ) 60 tablet   . Cholecalciferol (VITAMIN D) 50 MCG (2000 UT) CAPS Take 1 capsule by mouth daily.     Marland Kitchen levothyroxine (SYNTHROID) 88 MCG tablet TAKE ONE TABLET BY MOUTH DAILY BEFORE BREAKFAST 90 tablet 0  . losartan-hydrochlorothiazide (HYZAAR) 100-25 MG tablet Take 1 tablet by mouth every morning.    . metFORMIN (GLUCOPHAGE-XR) 500 MG 24 hr tablet Take 2 tablets (1,000 mg total) by mouth every evening. 180 tablet 0  . Omega-3 Fatty Acids (FISH OIL) 1200 MG CAPS Take by mouth.    . pravastatin (PRAVACHOL) 40 MG tablet Take 1 tablet (40 mg total) by mouth at bedtime. 90 tablet 3  . vitamin B-12 (CYANOCOBALAMIN) 1000 MCG tablet Take 1,000 mcg by mouth daily.     . carvedilol (COREG) 6.25 MG tablet Take 1 tablet (6.25 mg total) by mouth 2 (two) times daily with a meal. 180 tablet 3  . omeprazole (PRILOSEC) 20 MG capsule Take 1 capsule (20 mg total) by mouth 2 (two) times daily. (Patient taking differently: Take 20 mg by mouth 2 (two) times daily as needed. ) 180 capsule 3    Allergies Allergies  Allergen Reactions  . Methocarbamol [Methocarbamol] Nausea And Vomiting    Family History Family History  Problem Relation Age of Onset  . Alzheimer's disease Mother   . CAD Mother   . Heart disease Mother   .  Lung disease Father 48       Pulmonary Embolus  . Diabetes Maternal Grandfather   . Heart disease Paternal Grandfather   . Thyroid disease Daughter     Review of Systems: Constitutional:  no fevers or chills Eye:  no recent significant change in vision Ear/Nose/Mouth/Throat:  Ears:  no recent hearing loss Nose/Mouth/Throat:  no complaints of nasal congestion or sore throat Cardiovascular:  no chest pain Respiratory:  no shortness of breath Gastrointestinal:  no abdominal pain, no change in bowel habits GU:  Male: negative for dysuria, frequency, and incontinence  Musculoskeletal/Extremities:  no pain of the joints Integumentary (Skin):  no abnormal skin lesions reported Neurologic:  no headaches, Endocrine:  No unexpected weight changes Hematologic/Lymphatic:  no areas of easy  bruising  Exam BP 132/84 (BP Location: Left Arm, Patient Position: Sitting, Cuff Size: Normal)   Pulse 71   Temp (!) 95.9 F (35.5 C) (Temporal)   Ht 5\' 8"  (1.727 m)   Wt 229 lb 4 oz (104 kg)   SpO2 98%   BMI 34.86 kg/m  General:  well developed, well nourished, in no apparent distress Skin:  no significant moles, warts, or growths Head:  no masses, lesions, or tenderness Eyes:  pupils equal and round, sclera anicteric without injection Ears:  canals without lesions, TMs shiny without retraction, no obvious effusion, no erythema Nose:  nares patent, septum midline, mucosa normal Throat/Pharynx:  lips and gingiva without lesion; tongue and uvula midline; non-inflamed pharynx; no exudates or postnasal drainage Neck: neck supple without adenopathy, thyromegaly, or masses Lungs:  clear to auscultation, breath sounds equal bilaterally, no respiratory distress Cardio:  regular rate and rhythm, no LE edema or bruits Abd: S, NT, ND, +bulge just to L of umbilicus Rectal: Deferred Musculoskeletal:  symmetrical muscle groups noted without atrophy or deformity Extremities:  no clubbing, cyanosis, or edema, no deformities, no skin discoloration Neuro:  gait normal; deep tendon reflexes normal and symmetric; sensation intact to pinprick Psych: well oriented with normal range of affect and appropriate judgment/insight   Assessment and Plan  Well adult exam - Plan: Lipid panel, Comprehensive metabolic panel  OSA on CPAP - Plan: Ambulatory referral to Pulmonology  GERD (gastroesophageal reflux disease) - Plan: omeprazole (PRILOSEC) 20 MG capsule  Hypothyroidism, unspecified type - Plan: TSH  Type 2 diabetes mellitus with hyperglycemia, without long-term current use of insulin (HCC) - Plan: Microalbumin / creatinine urine ratio, Hemoglobin A1c  Screening PSA (prostate specific antigen) - Plan: PSA  Ventral hernia without obstruction or gangrene - Plan: Ambulatory referral to General Surgery    Well 79 y.o. male. Counseled on diet and exercise. Counseled on risks and benefits of prostate cancer screening. Pt would like to continue being screened.  Other orders as above. Follow up in 6 mo or prn.  The patient voiced understanding and agreement to the plan.  [place], DO 08/25/19 9:29 AM

## 2019-09-01 ENCOUNTER — Other Ambulatory Visit: Payer: Self-pay | Admitting: Family Medicine

## 2019-09-01 DIAGNOSIS — R7301 Impaired fasting glucose: Secondary | ICD-10-CM

## 2019-09-10 DIAGNOSIS — H2511 Age-related nuclear cataract, right eye: Secondary | ICD-10-CM | POA: Diagnosis not present

## 2019-09-10 DIAGNOSIS — H25811 Combined forms of age-related cataract, right eye: Secondary | ICD-10-CM | POA: Diagnosis not present

## 2019-10-20 ENCOUNTER — Ambulatory Visit (INDEPENDENT_AMBULATORY_CARE_PROVIDER_SITE_OTHER): Payer: PPO | Admitting: Family Medicine

## 2019-10-20 ENCOUNTER — Telehealth: Payer: Self-pay | Admitting: Family Medicine

## 2019-10-20 ENCOUNTER — Other Ambulatory Visit: Payer: Self-pay

## 2019-10-20 ENCOUNTER — Encounter: Payer: Self-pay | Admitting: Family Medicine

## 2019-10-20 VITALS — BP 138/80 | HR 72 | Temp 95.8°F | Ht 69.0 in | Wt 223.0 lb

## 2019-10-20 DIAGNOSIS — R079 Chest pain, unspecified: Secondary | ICD-10-CM

## 2019-10-20 NOTE — Telephone Encounter (Signed)
We have placed a referral asap for him to be seen in the next week for his chest pain and evaluation. There is no specific cardiologist I want him to see, just to see one. Ty.

## 2019-10-20 NOTE — Telephone Encounter (Signed)
Hey Dr. Karene Fry [facility] "Brett Armstrong" wants to know what was the name of the cardiologist that you said might be able to see him in 2 weeks?  Dr.Crenshaw is not abe to see him until  01/12/20.Marland Kitchen Please advise  [phone]

## 2019-10-20 NOTE — Progress Notes (Signed)
Chief Complaint  Patient presents with  . Shortness of Breath    Brett Armstrong is a 79 y.o. male here for evaluation of chest tightness.  Duration of issue: 2 months Quality: tightness Palliation: rest Provocation: walking Severity: has difficulty describing severity Radiation: down R arm Duration of chest pain: 45 minutes Associated symptoms: sob, tingling across chest Cardiac history: HTN, HLD Family heart history: mom w CAD Smoker? No He is not currently having chest pain.   Past Medical History:  Diagnosis Date  . Arthritis    KNEES, HANDS   . Chronic kidney disease    HX OF KIDNEY STONES   . Diabetes mellitus ([place])   . ED (erectile dysfunction)   . GERD (gastroesophageal reflux disease)   . History of DVT (deep vein thrombosis) 2016   Provoked by surgery; moved to pulm  . Hyperlipidemia   . Hypertension   . Hypothyroid   . Nephrolithiasis   . OSA (obstructive sleep apnea)   . Peripheral vascular disease ([place])    left leg circulaton problem per pt   . Rosacea   . Vitamin B 12 deficiency    Family History  Problem Relation Age of Onset  . Alzheimer's disease Mother   . CAD Mother   . Heart disease Mother   . Lung disease Father 52       Pulmonary Embolus  . Diabetes Maternal Grandfather   . Heart disease Paternal Grandfather   . Thyroid disease Daughter     Allergies as of 10/20/2019      Reactions   Methocarbamol [methocarbamol] Nausea And Vomiting      Medication List       Accurate as of Oct 20, 2019  1:33 PM. If you have any questions, ask your nurse or doctor.        aspirin EC 325 MG tablet Take 1 tablet (325 mg total) by mouth 2 (two) times daily. What changed: when to take this   carvedilol 6.25 MG tablet Commonly known as: COREG Take 1 tablet (6.25 mg total) by mouth 2 (two) times daily with a meal.   Fish Oil 1200 MG Caps Take by mouth.   levothyroxine 88 MCG tablet Commonly known as: SYNTHROID TAKE ONE TABLET BY MOUTH DAILY  BEFORE BREAKFAST   losartan-hydrochlorothiazide 100-25 MG tablet Commonly known as: HYZAAR Take 1 tablet by mouth every morning.   metFORMIN 500 MG 24 hr tablet Commonly known as: GLUCOPHAGE-XR TAKE TWO TABLETS BY MOUTH EVERY EVENING   omeprazole 20 MG capsule Commonly known as: PRILOSEC Take 1 capsule (20 mg total) by mouth 2 (two) times daily as needed (reflux).   pravastatin 40 MG tablet Commonly known as: PRAVACHOL Take 1 tablet (40 mg total) by mouth at bedtime.   vitamin B-12 1000 MCG tablet Commonly known as: CYANOCOBALAMIN Take 1,000 mcg by mouth daily.   Vitamin D 50 MCG (2000 UT) Caps Take 1 capsule by mouth daily.       BP 138/80 (BP Location: Left Arm, Patient Position: Sitting, Cuff Size: Normal)   Pulse 72   Temp (!) 95.8 F (35.4 C) (Temporal)   Ht 5\' 9"  (1.753 m)   Wt 223 lb (101.2 kg)   SpO2 96%   BMI 32.93 kg/m  Gen: awake, alert, appears stated age HEENT: PERRLA, MMM Neck: No masses or asymmetry Heart: RRR, no bruits, no LE edema Lungs: CTAB, no accessory muscle use Abd: Soft, NT, ND, no masses or organomegaly MSK: chest pain is not reproducible  to palptation Psych: Age appropriate judgment and insight, nml mood and affect  Chest pain, unspecified type - Plan: EKG 12-Lead  EKG. NSR, nml axis, prolonged PR inteveral, otherwise reg intervals, no T wave changes, no signs of ischemia, nml R wave compression.  F/u prn. The patient voiced understanding and agreement to the plan.  [place], DO 10/20/19 1:33 PM

## 2019-10-20 NOTE — Patient Instructions (Addendum)
Listen to your body. I want you to take it easy if you start having chest tightness moving forward.   Call Dr. Stanford Breed for an appointment within the next week. I have placed a referral for anyone in the next week as a contingency.   Your EKG is not concerning for a heart attack.   Let us know if you need anything.

## 2019-10-20 NOTE — Telephone Encounter (Signed)
Called informed of PCP response He verbalized understanding.

## 2019-11-09 ENCOUNTER — Telehealth: Payer: Self-pay | Admitting: Family Medicine

## 2019-11-09 DIAGNOSIS — I10 Essential (primary) hypertension: Secondary | ICD-10-CM

## 2019-11-09 MED ORDER — LOSARTAN POTASSIUM-HCTZ 100-25 MG PO TABS: 1.0000 | ORAL_TABLET | ORAL | 2 refills | Status: DC

## 2019-11-09 MED ORDER — CARVEDILOL 6.25 MG PO TABS: 6.2500 mg | ORAL_TABLET | Freq: Two times a day (BID) | ORAL | 2 refills | Status: DC

## 2019-11-09 NOTE — Telephone Encounter (Signed)
Refill done and called the patient informed done.

## 2019-11-09 NOTE — Telephone Encounter (Signed)
Medication: losartan-hydrochlorothiazide (HYZAAR) 100-25 MG tablet  carvedilol (COREG) 6.25 MG tablet   Patient states these 2 meds  Needs to be changed from his old provider to [facility].   He also needs a refill and requesting for 90 day refill  Has the patient contacted their pharmacy? Yes.   (If no, request that the patient contact the pharmacy for the refill.) (If yes, when and what did the pharmacy advise?) Call PCP to get meds moved to correct provider   Preferred Pharmacy (with phone number or street name):  [facility], [address] Phone:  [phone]  Fax:  [phone]      Agent: Please be advised that RX refills may take up to 3 business days. We ask that you follow-up with your pharmacy.

## 2019-12-03 ENCOUNTER — Telehealth: Payer: Self-pay

## 2019-12-03 DIAGNOSIS — R7301 Impaired fasting glucose: Secondary | ICD-10-CM

## 2019-12-03 MED ORDER — METFORMIN HCL ER 500 MG PO TB24: 1000.0000 mg | ORAL_TABLET | Freq: Every evening | ORAL | 1 refills | Status: DC

## 2019-12-03 NOTE — Telephone Encounter (Signed)
Sent in and called the patient to inform

## 2019-12-03 NOTE — Telephone Encounter (Signed)
Patient pharmacy  Called in to get a prescription refill for metFORMIN (GLUCOPHAGE-XR) 500 MG 24 hr tablet [[id number]]    Please send it to [facility], [address], [place] [address]  Phone:  [phone] Fax:  [phone]

## 2019-12-21 ENCOUNTER — Telehealth: Payer: Self-pay

## 2019-12-21 MED ORDER — LEVOTHYROXINE SODIUM 88 MCG PO TABS: ORAL_TABLET | ORAL | 0 refills | Status: DC

## 2019-12-21 NOTE — Telephone Encounter (Signed)
Pt stated he would like his levothyroxine to be qty 90.  He stated he just picked up a script and it was for qty 20.  Pharmacy is [facility], [place].

## 2019-12-21 NOTE — Telephone Encounter (Signed)
Sent in a 90 day supply 

## 2020-01-07 NOTE — Progress Notes (Signed)
Referring-Nicholas Nolene Ebbs DO Reason for referral-Chest pain  HPI: 79 yo male for evaluation of CP at request of [name] DO. Seen previously but not since 3/18. Nuclear study 3/18 showed EF 60 and normal perfusion.  Patient States approximately 2 months ago he had a period of time when he had worsening dyspnea.  It occurred with activities but also at rest.  He had a chest tightness/heaviness related to this.  This has now improved.  He walks daily and denies dyspnea on exertion, exertional chest pain, palpitations or syncope.  Cardiology now asked to evaluate.  Note his tightness was worse with inspiration.  Current Outpatient Medications  Medication Sig Dispense Refill  . aspirin EC 325 MG tablet Take 1 tablet (325 mg total) by mouth 2 (two) times daily. (Patient taking differently: Take 325 mg by mouth once. ) 60 tablet   . carvedilol (COREG) 6.25 MG tablet Take 1 tablet (6.25 mg total) by mouth 2 (two) times daily with a meal. 180 tablet 2  . Cholecalciferol (VITAMIN D) 50 MCG (2000 UT) CAPS Take 1 capsule by mouth daily.    Marland Kitchen levothyroxine (SYNTHROID) 88 MCG tablet TAKE ONE TABLET BY MOUTH DAILY BEFORE BREAKFAST 90 tablet 0  . losartan-hydrochlorothiazide (HYZAAR) 100-25 MG tablet Take 1 tablet by mouth every morning. 90 tablet 2  . metFORMIN (GLUCOPHAGE-XR) 500 MG 24 hr tablet Take 2 tablets (1,000 mg total) by mouth every evening. 180 tablet 1  . Omega-3 Fatty Acids (FISH OIL) 1200 MG CAPS Take by mouth.    Marland Kitchen omeprazole (PRILOSEC) 20 MG capsule Take 1 capsule (20 mg total) by mouth 2 (two) times daily as needed (reflux). 180 capsule 2  . pravastatin (PRAVACHOL) 40 MG tablet Take 1 tablet (40 mg total) by mouth at bedtime. 90 tablet 3  . vitamin B-12 (CYANOCOBALAMIN) 1000 MCG tablet Take 1,000 mcg by mouth daily.      No current facility-administered medications for this visit.    Allergies  Allergen Reactions  . Methocarbamol [Methocarbamol] Nausea And  Vomiting     Past Medical History:  Diagnosis Date  . Arthritis    KNEES, HANDS   . Chronic kidney disease    HX OF KIDNEY STONES   . Diabetes mellitus ([place])   . ED (erectile dysfunction)   . GERD (gastroesophageal reflux disease)   . History of DVT (deep vein thrombosis) 2016   Provoked by surgery; moved to pulm  . Hyperlipidemia   . Hypertension   . Hypothyroid   . Nephrolithiasis   . OSA (obstructive sleep apnea)   . Peripheral vascular disease ([place])    left leg circulaton problem per pt   . Rosacea   . Vitamin B 12 deficiency     Past Surgical History:  Procedure Laterality Date  . APPENDECTOMY    . EYE SURGERY Left 07/19/2019   Cataract removal  . HERNIA REPAIR     UMBILICAL   . LITHOTRIPSY     X 4  . OTHER SURGICAL HISTORY     TUMOR REMOVED RIGHT FACE - BENIGN   . OTHER SURGICAL HISTORY     small facial tumor removal 1970s  . TOTAL KNEE ARTHROPLASTY  06/24/2011   Procedure: TOTAL KNEE ARTHROPLASTY;  Surgeon: Mauri Pole;  Location: [facility];  Service: Orthopedics;  Laterality: Right;  . TOTAL KNEE ARTHROPLASTY  09/03/2011   Procedure: TOTAL KNEE ARTHROPLASTY;  Surgeon: Mauri Pole, MD;  Location: [facility];  Service: Orthopedics;  Laterality: Left;    Social History   Socioeconomic History  . Marital status: Married    Spouse name: Romie Minus  . Number of children: 2  . Years of education: 11  . Highest education level: Not on file  Occupational History  . Not on file  Tobacco Use  . Smoking status: Never Smoker  . Smokeless tobacco: Never Used  Substance and Sexual Activity  . Alcohol use: No  . Drug use: No  . Sexual activity: Yes    Partners: Female  Other Topics Concern  . Not on file  Social History Narrative   Marital Status: Married Romie Minus)    Children:  Daughters Caren Griffins, Santiago Glad)    Pets:  Dog (1)    Living Situation: Lives with spouse.   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 Difficulty of Paying Living Expenses: Not hard at all  Food Insecurity: No Food Insecurity  . Worried About Charity fundraiser in the Last Year: Never true  . Ran Out of Food in the Last Year: Never true  Transportation Needs: No Transportation Needs  . Lack of Transportation (Medical): No  . Lack of Transportation (Non-Medical): No  Physical Activity:   . Days of Exercise per Week:   . Minutes of Exercise per Session:   Stress:   . Feeling of Stress :   Social Connections:   . Frequency of Communication with Friends and Family:   . Frequency of Social Gatherings with Friends and Family:   . Attends Religious Services:   . Active Member of Clubs or Organizations:   . Attends Archivist Meetings:   Marland Kitchen Marital Status:   Intimate Partner Violence:   . Fear of Current or Ex-Partner:   . Emotionally Abused:   Marland Kitchen Physically Abused:   . Sexually Abused:     Family History  Problem Relation Age of Onset  . Alzheimer's disease Mother   . CAD Mother   . Heart disease Mother   . Lung disease Father 76       Pulmonary Embolus  . Diabetes Maternal Grandfather   . Heart disease Paternal Grandfather   . Thyroid disease Daughter     ROS: no fevers or chills, productive cough, hemoptysis, dysphasia, odynophagia, melena, hematochezia, dysuria, hematuria, rash, seizure activity, orthopnea, PND, pedal edema, claudication. Remaining systems are negative.  Physical Exam:   Blood pressure (!) 150/86, pulse 65, height 5\' 8"  (1.727 m), weight (!) 231 lb (104.8 kg), SpO2 94 %.  General:  Well developed/well nourished in NAD Skin warm/dry Patient not depressed No peripheral clubbing Back-normal HEENT-normal/normal eyelids Neck supple/normal carotid upstroke bilaterally; no bruits;  no JVD; no thyromegaly chest - CTA/ normal expansion CV - RRR/normal S1 and S2; no murmurs, rubs or gallops;  PMI nondisplaced Abdomen -NT/ND, no HSM, no mass, + bowel sounds, no bruit 2+ femoral pulses, no bruits Ext-no edema, chords, 2+ DP Neuro-grossly nonfocal  ECG - 10-20-19; sinus bradycardia, first degree AV block and RBBB. personally reviewed  Today's electrocardiogram shows sinus rhythm at a rate of 65, first-degree AV block and right bundle branch block, personally reviewed.  A/P  1 Chest pain -symptoms are somewhat atypical.  We will arrange a Roe nuclear study for risk stratification.  2 hypertension-BP mildly elevated.  I have asked him to follow his blood pressure and we will add additional medications as needed.  3 hyperlipidemia-continue statin.  Kirk Ruths, MD

## 2020-01-12 ENCOUNTER — Ambulatory Visit: Payer: PPO | Admitting: Cardiology

## 2020-01-12 ENCOUNTER — Other Ambulatory Visit: Payer: Self-pay

## 2020-01-12 ENCOUNTER — Encounter: Payer: Self-pay | Admitting: Cardiology

## 2020-01-12 VITALS — BP 150/86 | HR 65 | Ht 68.0 in | Wt 231.0 lb

## 2020-01-12 DIAGNOSIS — I10 Essential (primary) hypertension: Secondary | ICD-10-CM

## 2020-01-12 DIAGNOSIS — E7800 Pure hypercholesterolemia, unspecified: Secondary | ICD-10-CM

## 2020-01-12 DIAGNOSIS — R072 Precordial pain: Secondary | ICD-10-CM | POA: Diagnosis not present

## 2020-01-12 NOTE — Patient Instructions (Signed)
Medication Instructions:  No Changes *If you need a refill on your cardiac medications before your next appointment, please call your pharmacy*   Lab Work: None Ordered If you have labs (blood work) drawn today and your tests are completely normal, you will receive your results only by: Marland Kitchen MyChart Message (if you have MyChart) OR . A paper copy in the mail If you have any lab test that is abnormal or we need to change your treatment, we will call you to review the results.   Testing/Procedures:  Your physician has requested that you have a lexiscan myoview. For further information please visit [email or web address]. Please follow instruction sheet, as given. This test will be done at [facility].    Follow-Up: At [facility], you and your health needs are our priority.  As part of our continuing mission to provide you with exceptional heart care, we have created designated Provider Care Teams.  These Care Teams include your primary Cardiologist (physician) and Advanced Practice Providers (APPs -  Physician Assistants and Nurse Practitioners) who all work together to provide you with the care you need, when you need it.  We recommend signing up for the patient portal called "MyChart".  Sign up information is provided on this After Visit Summary.  MyChart is used to connect with patients for Virtual Visits (Telemedicine).  Patients are able to view lab/test results, encounter notes, upcoming appointments, etc.  Non-urgent messages can be sent to your provider as well.   To learn more about what you can do with MyChart, go to [email or web address].    Your next appointment:   Follow Up as needed  Provider:   Kirk Ruths, MD   Other Instructions

## 2020-01-21 ENCOUNTER — Telehealth (HOSPITAL_COMMUNITY): Payer: Self-pay | Admitting: *Deleted

## 2020-01-21 NOTE — Telephone Encounter (Signed)
Patient given detailed instructions per Myocardial Perfusion Study Information Sheet for the test on 01/26/20 at 10:45. Patient notified to arrive 15 minutes early and that it is imperative to arrive on time for appointment to keep from having the test rescheduled.  If you need to cancel or reschedule your appointment, please call the office within 24 hours of your appointment. . Patient verbalized understanding.Brett Armstrong

## 2020-01-26 ENCOUNTER — Other Ambulatory Visit: Payer: Self-pay

## 2020-01-26 ENCOUNTER — Ambulatory Visit (HOSPITAL_COMMUNITY): Payer: PPO | Attending: Cardiovascular Disease

## 2020-01-26 DIAGNOSIS — R072 Precordial pain: Secondary | ICD-10-CM | POA: Diagnosis not present

## 2020-01-26 LAB — MYOCARDIAL PERFUSION IMAGING
LV dias vol: 124 mL (ref 62–150)
LV sys vol: 61 mL
Peak HR: 84 {beats}/min
Rest HR: 62 {beats}/min
SDS: 0
SRS: 0
SSS: 0
TID: 1.07

## 2020-01-26 MED ORDER — TECHNETIUM TC 99M TETROFOSMIN IV KIT
31.5000 | PACK | Freq: Once | INTRAVENOUS | Status: AC | PRN
  Administered 2020-01-26: 31.5 via INTRAVENOUS
  Filled 2020-01-26: qty 32

## 2020-01-26 MED ORDER — REGADENOSON 0.4 MG/5ML IV SOLN
0.4000 mg | Freq: Once | INTRAVENOUS | Status: AC
  Administered 2020-01-26: 12:00:00 0.4 mg via INTRAVENOUS

## 2020-01-26 MED ORDER — TECHNETIUM TC 99M TETROFOSMIN IV KIT
10.6000 | PACK | Freq: Once | INTRAVENOUS | Status: AC | PRN
  Administered 2020-01-26: 10.6 via INTRAVENOUS
  Filled 2020-01-26: qty 11

## 2020-02-12 ENCOUNTER — Ambulatory Visit: Payer: Self-pay | Attending: Internal Medicine

## 2020-02-12 DIAGNOSIS — Z23 Encounter for immunization: Secondary | ICD-10-CM

## 2020-02-12 NOTE — Progress Notes (Signed)
   Covid-19 Vaccination Clinic  Name:  [name]    MRN: [id number] DOB: Jul 31, 1940  12/17/3015  Mr. Chamberlin was observed post Covid-19 immunization for 15 minutes without incident. He was provided with Vaccine Information Sheet and instruction to access the V-Safe system.   Mr. Hoben was instructed to call 911 with any severe reactions post vaccine: Marland Kitchen Difficulty breathing  . Swelling of face and throat  . A fast heartbeat  . A bad rash all over body  . Dizziness and weakness

## 2020-02-23 ENCOUNTER — Other Ambulatory Visit: Payer: Self-pay

## 2020-02-23 ENCOUNTER — Ambulatory Visit (INDEPENDENT_AMBULATORY_CARE_PROVIDER_SITE_OTHER): Payer: PPO | Admitting: Family Medicine

## 2020-02-23 ENCOUNTER — Encounter: Payer: Self-pay | Admitting: Family Medicine

## 2020-02-23 VITALS — BP 132/78 | HR 68 | Temp 97.8°F | Ht 69.0 in | Wt 228.0 lb

## 2020-02-23 DIAGNOSIS — Z1159 Encounter for screening for other viral diseases: Secondary | ICD-10-CM | POA: Diagnosis not present

## 2020-02-23 DIAGNOSIS — E039 Hypothyroidism, unspecified: Secondary | ICD-10-CM | POA: Diagnosis not present

## 2020-02-23 DIAGNOSIS — E1165 Type 2 diabetes mellitus with hyperglycemia: Secondary | ICD-10-CM

## 2020-02-23 DIAGNOSIS — I10 Essential (primary) hypertension: Secondary | ICD-10-CM

## 2020-02-23 MED ORDER — LEVOTHYROXINE SODIUM 88 MCG PO TABS: ORAL_TABLET | ORAL | 2 refills | Status: DC

## 2020-02-23 NOTE — Progress Notes (Signed)
Subjective:   Chief Complaint  Patient presents with  . Follow-up    Brett Armstrong is a 79 y.o. male here for follow-up of diabetes.   Buzz does not routinely monitor his sugars.  Patient does not require insulin.   Medications include: Metformin XR 1000 mg/d Diet is healthy overall.  Exercise: active at work, walking  Hypertension Patient presents for hypertension follow up. He does monitor home blood pressures. Blood pressures ranging on average from 140-150's/80's. He is compliant with medications- Coreg 6.25 mg bid, Hyzaar 100-25 mg/d. Patient has these side effects of medication: none  Diet/exercise as above.   Hypothyroidism Patient presents for follow-up of hypothyroidism.  Reports compliance with medication- Synthroid 88 mcg/d. Current symptoms include: denies fatigue, weight changes, heat/cold intolerance, bowel/skin changes or CVS symptoms He believes his dose should be unchanged  Past Medical History:  Diagnosis Date  . Arthritis    KNEES, HANDS   . Chronic kidney disease    HX OF KIDNEY STONES   . Diabetes mellitus ([place])   . ED (erectile dysfunction)   . GERD (gastroesophageal reflux disease)   . History of DVT (deep vein thrombosis) 2016   Provoked by surgery; moved to pulm  . Hyperlipidemia   . Hypertension   . Hypothyroid   . Nephrolithiasis   . OSA (obstructive sleep apnea)   . Peripheral vascular disease ([place])    left leg circulaton problem per pt   . Rosacea   . Vitamin B 12 deficiency      Related testing: Retinal exam: Done Pneumovax: done  Objective:  BP 132/78 (BP Location: Left Arm, Patient Position: Sitting, Cuff Size: Normal)   Pulse 68   Temp 97.8 F (36.6 C) (Oral)   Ht 5\' 9"  (1.753 m)   Wt 228 lb (103.4 kg)   SpO2 98%   BMI 33.67 kg/m  General:  Well developed, well nourished, in no apparent distress Skin:  Warm, no pallor or diaphoresis Head:  Normocephalic, atraumatic Eyes:  Pupils equal and round, sclera anicteric  without injection  Lungs:  CTAB, no access msc use Cardio:  RRR, no bruits, no LE edema Musculoskeletal:  Symmetrical muscle groups noted without atrophy or deformity Psych: Age appropriate judgment and insight  Assessment:   Type 2 diabetes mellitus with hyperglycemia, without long-term current use of insulin (HCC) - Plan: Comprehensive metabolic panel, Hemoglobin A1c, Lipid panel  Essential hypertension, benign  Hypothyroidism, unspecified type  Encounter for hepatitis C screening test for low risk patient - Plan: Hepatitis C antibody   Plan:   1. Cont care. 2. Cont care. 3. Cont care. 4. Screen.  F/u in 6 mo. The patient voiced understanding and agreement to the plan.  [place], DO 02/23/20 7:59 AM

## 2020-02-23 NOTE — Patient Instructions (Addendum)
Give [place] 2-3 business days to get the results of your labs back.   Keep the diet clean and stay active.  I recommend getting the flu shot in mid October. This suggestion would change if the CDC comes out with a different recommendation.   Let me know if you would like to see an audiologist again.   Let us know if you need anything.

## 2020-02-24 LAB — HEPATITIS C ANTIBODY
Hepatitis C Ab: NONREACTIVE
SIGNAL TO CUT-OFF: 0.01 (ref <1.00)

## 2020-02-24 LAB — LIPID PANEL
Cholesterol: 155 mg/dL (ref <200)
HDL: 40 mg/dL (ref >=40)
LDL Cholesterol (Calc): 84 mg/dL (calc)
Non-HDL Cholesterol (Calc): 115 mg/dL (calc) (ref <130)
Total CHOL/HDL Ratio: 3.9 (calc) (ref <5.0)
Triglycerides: 225 mg/dL — ABNORMAL HIGH (ref <150)

## 2020-02-24 LAB — COMPREHENSIVE METABOLIC PANEL
AG Ratio: 1.5 (calc) (ref 1.0–2.5)
ALT: 25 U/L (ref 9–46)
AST: 21 U/L (ref 10–35)
Albumin: 4.5 g/dL (ref 3.6–5.1)
Alkaline phosphatase (APISO): 56 U/L (ref 35–144)
BUN: 17 mg/dL (ref 7–25)
CO2: 32 mmol/L (ref 20–32)
Calcium: 10 mg/dL (ref 8.6–10.3)
Chloride: 99 mmol/L (ref 98–110)
Creat: 1.1 mg/dL (ref 0.70–1.18)
Globulin: 3 g/dL (calc) (ref 1.9–3.7)
Glucose, Bld: 107 mg/dL — ABNORMAL HIGH (ref 65–99)
Potassium: 3.5 mmol/L (ref 3.5–5.3)
Sodium: 140 mmol/L (ref 135–146)
Total Bilirubin: 0.6 mg/dL (ref 0.2–1.2)
Total Protein: 7.5 g/dL (ref 6.1–8.1)

## 2020-02-24 LAB — HEMOGLOBIN A1C
Hgb A1c MFr Bld: 6.2 % of total Hgb — ABNORMAL HIGH (ref <5.7)
Mean Plasma Glucose: 131 (calc)
eAG (mmol/L): 7.3 (calc)

## 2020-02-25 ENCOUNTER — Other Ambulatory Visit: Payer: Self-pay | Admitting: Family Medicine

## 2020-02-25 DIAGNOSIS — E785 Hyperlipidemia, unspecified: Secondary | ICD-10-CM

## 2020-03-15 ENCOUNTER — Ambulatory Visit (INDEPENDENT_AMBULATORY_CARE_PROVIDER_SITE_OTHER): Payer: PPO

## 2020-03-15 ENCOUNTER — Other Ambulatory Visit: Payer: Self-pay

## 2020-03-15 DIAGNOSIS — Z23 Encounter for immunization: Secondary | ICD-10-CM

## 2020-03-15 NOTE — Progress Notes (Addendum)
Pt here today for High dose flu vaccine. Pt denies allergy to eggs, and expresses he has not had covid vaccine or booster within the last 2 weeks.   Fluad Quadrivalent 0.45mL injected into L deltoid. Pt tolerated injection well.   Noted. Crosby Oyster Wendling 4:01 PM 03/15/20

## 2020-04-07 NOTE — Addendum Note (Signed)
Addended by: Kelle Darting A on: 04/07/2020 03:35 PM   Modules accepted: Orders

## 2020-04-12 ENCOUNTER — Other Ambulatory Visit: Payer: Self-pay

## 2020-04-12 ENCOUNTER — Other Ambulatory Visit (INDEPENDENT_AMBULATORY_CARE_PROVIDER_SITE_OTHER): Payer: PPO

## 2020-04-12 DIAGNOSIS — E785 Hyperlipidemia, unspecified: Secondary | ICD-10-CM

## 2020-04-13 LAB — LIPID PANEL
Cholesterol: 157 mg/dL (ref <200)
HDL: 40 mg/dL (ref >=40)
LDL Cholesterol (Calc): 90 mg/dL (calc)
Non-HDL Cholesterol (Calc): 117 mg/dL (calc) (ref <130)
Total CHOL/HDL Ratio: 3.9 (calc) (ref <5.0)
Triglycerides: 175 mg/dL — ABNORMAL HIGH (ref <150)

## 2020-05-06 ENCOUNTER — Other Ambulatory Visit: Payer: Self-pay | Admitting: Family Medicine

## 2020-05-06 DIAGNOSIS — E785 Hyperlipidemia, unspecified: Secondary | ICD-10-CM

## 2020-05-31 ENCOUNTER — Other Ambulatory Visit: Payer: Self-pay | Admitting: Family Medicine

## 2020-05-31 DIAGNOSIS — R7301 Impaired fasting glucose: Secondary | ICD-10-CM

## 2020-08-04 ENCOUNTER — Other Ambulatory Visit: Payer: Self-pay | Admitting: Family Medicine

## 2020-08-04 DIAGNOSIS — I10 Essential (primary) hypertension: Secondary | ICD-10-CM

## 2020-08-23 ENCOUNTER — Other Ambulatory Visit: Payer: Self-pay

## 2020-08-23 ENCOUNTER — Encounter: Payer: Self-pay | Admitting: Family Medicine

## 2020-08-23 ENCOUNTER — Ambulatory Visit (INDEPENDENT_AMBULATORY_CARE_PROVIDER_SITE_OTHER): Payer: PPO | Admitting: Family Medicine

## 2020-08-23 VITALS — BP 146/76 | HR 67 | Temp 98.6°F | Resp 20 | Ht 69.0 in | Wt 232.0 lb

## 2020-08-23 DIAGNOSIS — G4733 Obstructive sleep apnea (adult) (pediatric): Secondary | ICD-10-CM | POA: Diagnosis not present

## 2020-08-23 DIAGNOSIS — Z0001 Encounter for general adult medical examination with abnormal findings: Secondary | ICD-10-CM

## 2020-08-23 DIAGNOSIS — R350 Frequency of micturition: Secondary | ICD-10-CM | POA: Diagnosis not present

## 2020-08-23 DIAGNOSIS — E1169 Type 2 diabetes mellitus with other specified complication: Secondary | ICD-10-CM

## 2020-08-23 DIAGNOSIS — E669 Obesity, unspecified: Secondary | ICD-10-CM

## 2020-08-23 DIAGNOSIS — Z1331 Encounter for screening for depression: Secondary | ICD-10-CM | POA: Diagnosis not present

## 2020-08-23 DIAGNOSIS — E039 Hypothyroidism, unspecified: Secondary | ICD-10-CM | POA: Diagnosis not present

## 2020-08-23 DIAGNOSIS — Z9989 Dependence on other enabling machines and devices: Secondary | ICD-10-CM | POA: Diagnosis not present

## 2020-08-23 LAB — COMPREHENSIVE METABOLIC PANEL
ALT: 24 U/L (ref 0–53)
AST: 21 U/L (ref 0–37)
Albumin: 4.4 g/dL (ref 3.5–5.2)
Alkaline Phosphatase: 52 U/L (ref 39–117)
BUN: 15 mg/dL (ref 6–23)
CO2: 30 mEq/L (ref 19–32)
Calcium: 9.5 mg/dL (ref 8.4–10.5)
Chloride: 100 mEq/L (ref 96–112)
Creatinine, Ser: 1.02 mg/dL (ref 0.40–1.50)
GFR: 70.94 mL/min (ref >60.00)
Glucose, Bld: 103 mg/dL — ABNORMAL HIGH (ref 70–99)
Potassium: 3.6 mEq/L (ref 3.5–5.1)
Sodium: 139 mEq/L (ref 135–145)
Total Bilirubin: 0.7 mg/dL (ref 0.2–1.2)
Total Protein: 7.3 g/dL (ref 6.0–8.3)

## 2020-08-23 LAB — MICROALBUMIN / CREATININE URINE RATIO
Creatinine,U: 96.7 mg/dL
Microalb Creat Ratio: 0.9 mg/g (ref 0.0–30.0)
Microalb, Ur: 0.9 mg/dL (ref 0.0–1.9)

## 2020-08-23 LAB — LIPID PANEL
Cholesterol: 147 mg/dL (ref 0–200)
HDL: 40.3 mg/dL (ref >39.00)
LDL Cholesterol: 76 mg/dL (ref 0–99)
NonHDL: 106.35
Total CHOL/HDL Ratio: 4
Triglycerides: 152 mg/dL — ABNORMAL HIGH (ref 0.0–149.0)
VLDL: 30.4 mg/dL (ref 0.0–40.0)

## 2020-08-23 LAB — HEMOGLOBIN A1C: Hgb A1c MFr Bld: 6.5 % (ref 4.6–6.5)

## 2020-08-23 LAB — TSH: TSH: 3.5 u[IU]/mL (ref 0.35–4.50)

## 2020-08-23 MED ORDER — TAMSULOSIN HCL 0.4 MG PO CAPS: 0.4000 mg | ORAL_CAPSULE | Freq: Every day | ORAL | 3 refills | Status: DC

## 2020-08-23 NOTE — Patient Instructions (Addendum)
Give us 2-3 business days to get the results of your labs back.   Keep the diet clean and stay active.  If you do not hear anything about your referral in the next 1-2 weeks, call our office and ask for an update.  Let us know if you need anything. 

## 2020-08-23 NOTE — Progress Notes (Signed)
Chief Complaint  Patient presents with  . Annual Exam    Doing well , no complaints    Well Male Brett Armstrong is here for a complete physical.   His last physical was >1 year ago.  Current diet: in general, a "healthy" diet.   Current exercise: active at work Weight trend: stable Fatigue out of ordinary? No. Seat belt? Yes.   Loss of interested in doing things or depression in past 2 weeks? No  Health maintenance Shingrix- Yes Tetanus- Yes Hep C- Yes Pneumonia vaccine- Yes   Urinary freq Going every hour while awake. No pain/bleeding/d/c. Has been having for past 3 mo. Stays hydrated. Does not consume a lot of caffeine or alcohol. Gets up 1x/night to urinate.   Past Medical History:  Diagnosis Date  . Arthritis    KNEES, HANDS   . Chronic kidney disease    HX OF KIDNEY STONES   . Diabetes mellitus ([place])   . ED (erectile dysfunction)   . GERD (gastroesophageal reflux disease)   . History of DVT (deep vein thrombosis) 2016   Provoked by surgery; moved to pulm  . Hyperlipidemia   . Hypertension   . Hypothyroid   . Nephrolithiasis   . OSA (obstructive sleep apnea)   . Peripheral vascular disease ([place])    left leg circulaton problem per pt   . Rosacea   . Vitamin B 12 deficiency      Past Surgical History:  Procedure Laterality Date  . APPENDECTOMY    . EYE SURGERY Left 07/19/2019   Cataract removal  . HERNIA REPAIR     UMBILICAL   . LITHOTRIPSY     X 4  . OTHER SURGICAL HISTORY     TUMOR REMOVED RIGHT FACE - BENIGN   . OTHER SURGICAL HISTORY     small facial tumor removal 1970s  . TOTAL KNEE ARTHROPLASTY  06/24/2011   Procedure: TOTAL KNEE ARTHROPLASTY;  Surgeon: Mauri Pole;  Location: [facility];  Service: Orthopedics;  Laterality: Right;  . TOTAL KNEE ARTHROPLASTY  09/03/2011   Procedure: TOTAL KNEE ARTHROPLASTY;  Surgeon: Mauri Pole, MD;  Location: [facility];  Service: Orthopedics;  Laterality: Left;    Medications  Current Outpatient Medications on  File Prior to Visit  Medication Sig Dispense Refill  . aspirin EC 325 MG tablet Take 1 tablet (325 mg total) by mouth 2 (two) times daily. (Patient taking differently: Take 325 mg by mouth once.) 60 tablet   . carvedilol (COREG) 6.25 MG tablet TAKE ONE TABLET BY MOUTH TWICE A DAY WITH MEALS 180 tablet 2  . Cholecalciferol (VITAMIN D) 50 MCG (2000 UT) CAPS Take 1 capsule by mouth daily.    Marland Kitchen levothyroxine (SYNTHROID) 88 MCG tablet TAKE ONE TABLET BY MOUTH DAILY BEFORE BREAKFAST 90 tablet 2  . losartan-hydrochlorothiazide (HYZAAR) 100-25 MG tablet TAKE ONE TABLET BY MOUTH EVERY MORNING 90 tablet 2  . metFORMIN (GLUCOPHAGE-XR) 500 MG 24 hr tablet Take 2 tablets (1,000 mg total) by mouth every evening. 180 tablet 1  . Omega-3 Fatty Acids (FISH OIL) 1200 MG CAPS Take by mouth.    . pravastatin (PRAVACHOL) 40 MG tablet TAKE ONE TABLET BY MOUTH EVERY NIGHT AT BEDTIME 82 tablet 1  . vitamin B-12 (CYANOCOBALAMIN) 1000 MCG tablet Take 1,000 mcg by mouth daily.    Marland Kitchen omeprazole (PRILOSEC) 20 MG capsule Take 1 capsule (20 mg total) by mouth 2 (two) times daily as needed (reflux). 180 capsule 2   Allergies Allergies  Allergen Reactions  . Methocarbamol [Methocarbamol] Nausea And Vomiting    Family History Family History  Problem Relation Age of Onset  . Alzheimer's disease Mother   . CAD Mother   . Heart disease Mother   . Lung disease Father 38       Pulmonary Embolus  . Diabetes Maternal Grandfather   . Heart disease Paternal Grandfather   . Thyroid disease Daughter     Review of Systems: Constitutional:  no fevers Eye:  no recent significant change in vision Ears:  No changes in hearing Nose/Mouth/Throat:  no complaints of nasal congestion, no sore throat Cardiovascular: no chest pain Respiratory:  No shortness of breath Gastrointestinal:  No change in bowel habits GU:  + frequency Integumentary:  no abnormal skin lesions reported Neurologic:  no headaches Endocrine:  denies  unexplained weight changes  Exam BP (!) 146/76   Pulse 67   Temp 98.6 F (37 C)   Resp 20   Ht 5\' 9"  (1.753 m)   Wt 232 lb (105.2 kg)   SpO2 95%   BMI 34.26 kg/m  General:  well developed, well nourished, in no apparent distress Skin:  no significant moles, warts, or growths Head:  no masses, lesions, or tenderness Eyes:  pupils equal and round, sclera anicteric without injection Ears:  canals without lesions, TMs shiny without retraction, no obvious effusion, no erythema Nose:  nares patent, septum midline, mucosa normal Throat/Pharynx:  lips and gingiva without lesion; tongue and uvula midline; non-inflamed pharynx; no exudates or postnasal drainage Lungs:  clear to auscultation, breath sounds equal bilaterally, no respiratory distress Cardio:  regular rate and rhythm, no LE edema or bruits Rectal: Deferred GI: BS+, S, NT, ND, no masses or organomegaly Musculoskeletal:  symmetrical muscle groups noted without atrophy or deformity Neuro:  gait normal; deep tendon reflexes normal and symmetric Psych: well oriented with normal range of affect and appropriate judgment/insight  Assessment and Plan  Encounter for well adult exam with abnormal findings  Diabetes mellitus type 2 in obese ([place]) - Plan: Microalbumin / creatinine urine ratio, Lipid panel, Hemoglobin A1c, Comprehensive metabolic panel  Hypothyroidism, unspecified type - Plan: TSH  Urinary frequency - Plan: tamsulosin (FLOMAX) 0.4 MG CAPS capsule  Depression screening negative  OSA on CPAP - Plan: Ambulatory referral to Pulmonology   Well 80 y.o. male. Counseled on diet and exercise. Other orders as above. Start Flomax 0.4 mg/d.  Follow up in 1 mo to reck urination.  The patient voiced understanding and agreement to the plan.  [place], DO 08/23/20 8:15 AM

## 2020-09-27 ENCOUNTER — Encounter: Payer: Self-pay | Admitting: Family Medicine

## 2020-09-27 ENCOUNTER — Other Ambulatory Visit: Payer: Self-pay

## 2020-09-27 ENCOUNTER — Ambulatory Visit (INDEPENDENT_AMBULATORY_CARE_PROVIDER_SITE_OTHER): Payer: PPO | Admitting: Family Medicine

## 2020-09-27 VITALS — BP 142/80 | HR 62 | Temp 97.7°F | Ht 68.0 in | Wt 228.1 lb

## 2020-09-27 DIAGNOSIS — R350 Frequency of micturition: Secondary | ICD-10-CM

## 2020-09-27 NOTE — Progress Notes (Signed)
Chief Complaint  Patient presents with  . Follow-up   Subjective Brett Armstrong is a 80 y.o. male who presents f/u urinary freq.  Started on Flomax 0.4 mg/d 1 week ago.  He started having dizziness and stopped taking it.  He denies hematuria, dc,and urinary infection. He reports he is drinking more water and his BM's are improved.   Past Medical History:  Diagnosis Date  . Arthritis    KNEES, HANDS   . Chronic kidney disease    HX OF KIDNEY STONES   . Diabetes mellitus ([place])   . ED (erectile dysfunction)   . GERD (gastroesophageal reflux disease)   . History of DVT (deep vein thrombosis) 2016   Provoked by surgery; moved to pulm  . Hyperlipidemia   . Hypertension   . Hypothyroid   . Nephrolithiasis   . OSA (obstructive sleep apnea)   . Peripheral vascular disease ([place])    left leg circulaton problem per pt   . Rosacea   . Vitamin B 12 deficiency    Exam BP (!) 142/80 (BP Location: Left Arm, Patient Position: Sitting, Cuff Size: Normal)   Pulse 62   Temp 97.7 F (36.5 C) (Oral)   Ht 5\' 8"  (1.727 m)   Wt 228 lb 2 oz (103.5 kg)   SpO2 97%   BMI 34.69 kg/m  General:  well developed, well nourished, in no apparent distress Abd: BS+, S, mild distension, NT Lungs:  clear to auscultation, breath sounds equal bilaterally; normal effort without accessory muscle use Cardio:  regular rate and rhythm Psych: well oriented with normal range of affect and appropriate judgement/insight  Assessment and Plan  Urinary frequency  Stop Flomax. It seems this was more related to constipation than we had thought. Stay hydrated and keep BM's nml.  Follow up in 6 mo. The patient voiced understanding and agreement to the plan.  [place], DO 09/27/20 7:43 AM

## 2020-09-27 NOTE — Patient Instructions (Addendum)
Stay active.   No changes for now.   Let's stay off of the Flomax. I think this was more related to constipation that we had thought.   Let us know if you need anything.

## 2020-10-03 ENCOUNTER — Telehealth: Payer: Self-pay | Admitting: Pulmonary Disease

## 2020-10-03 ENCOUNTER — Ambulatory Visit (INDEPENDENT_AMBULATORY_CARE_PROVIDER_SITE_OTHER): Payer: PPO | Admitting: Pulmonary Disease

## 2020-10-03 ENCOUNTER — Ambulatory Visit (INDEPENDENT_AMBULATORY_CARE_PROVIDER_SITE_OTHER): Payer: PPO

## 2020-10-03 ENCOUNTER — Other Ambulatory Visit: Payer: Self-pay

## 2020-10-03 ENCOUNTER — Encounter: Payer: Self-pay | Admitting: Pulmonary Disease

## 2020-10-03 VITALS — BP 148/74 | HR 71 | Temp 97.4°F | Ht 68.0 in | Wt 230.6 lb

## 2020-10-03 DIAGNOSIS — J849 Interstitial pulmonary disease, unspecified: Secondary | ICD-10-CM | POA: Diagnosis not present

## 2020-10-03 DIAGNOSIS — G4733 Obstructive sleep apnea (adult) (pediatric): Secondary | ICD-10-CM | POA: Diagnosis not present

## 2020-10-03 DIAGNOSIS — R0609 Other forms of dyspnea: Secondary | ICD-10-CM | POA: Insufficient documentation

## 2020-10-03 DIAGNOSIS — R0600 Dyspnea, unspecified: Secondary | ICD-10-CM

## 2020-10-03 DIAGNOSIS — Z9989 Dependence on other enabling machines and devices: Secondary | ICD-10-CM | POA: Diagnosis not present

## 2020-10-03 DIAGNOSIS — R0602 Shortness of breath: Secondary | ICD-10-CM | POA: Diagnosis not present

## 2020-10-03 NOTE — Telephone Encounter (Signed)
Chest x-ray showed mild prominent interstitial markings. Since he has dyspnea on exertion and I heard some crackles on exam, would suggest that we proceed with high-resolution CT of the chest to rule out scarring/fibrosis in the lungs

## 2020-10-03 NOTE — Patient Instructions (Signed)
Let us know if you need a Rx for CPAP supplies CXR today

## 2020-10-03 NOTE — Assessment & Plan Note (Signed)
CPAP seems to be working well on current auto settings.  He has applied for the [employer]ips recall and hopefully will get a new machine within a few weeks. He is compliant and has settled down with a nasal mask.  He gets his supplies online and we will provide a prescription as needed. Once he gets a new machine, we will obtain download and adjust settings as needed, would suggest auto settings of 5 to 12 cm Weight loss encouraged, compliance with goal of at least 4-6 hrs every night is the expectation. Advised against medications with sedative side effects Cautioned against driving when sleepy - understanding that sleepiness will vary on a day to day basis

## 2020-10-03 NOTE — Assessment & Plan Note (Signed)
Due to his history of distant exertion and fine dry crackles on exam, I ordered chest x-ray, this shows mild interstitial prominence. We will proceed with high-resolution CT of the chest

## 2020-10-03 NOTE — Progress Notes (Signed)
Subjective:    Patient ID: Brett Armstrong, male    DOB: [birth year], 80 y.o.   MRN: [id number]  HPI 80 year old never smoker presents to establish care for OSA. He was following with sleep physician at [facility], regional physicians. OSA was diagnosed by an overnight PSG in 2013, he was placed on auto CPAP with nasal mask and follow-up download in 2015 which I reviewed shows good control of events on average pressure 8 to 9 cm.  He reports good improvement in his daytime somnolence and fatigue.  He also used to have "restless" sleep which seems to have improved with CPAP.  He had a Philips machine and he was informed about the recall, he registered in December 2021 and is awaiting a replacement machine. His spouse feels that he does not sleep very well and occasionally jerks his arms around. Epworth sleepiness score is 9, he still works part-time and comes home and takes a nap for about an hour, naps are refreshing. Bedtime is 9:15 PM, sleep latency is minimal, sleeps on his side with 1 pillow, reports 1-2 nocturnal awakenings and is out of bed at 5:30 AM feeling rested with dryness of mouth but denies headaches. He has gained about 10 pounds since his original sleep study There is no history suggestive of cataplexy, sleep paralysis or parasomnias   He reports mild dyspnea on exertion and occasionally has to rest.  He denies cough or wheezing or frequent chest colds  Significant tests/ events reviewed  NPSG 05/2012 -AHI 18/hour, weight 215 pounds  Past Medical History:  Diagnosis Date  . Arthritis    KNEES, HANDS   . Chronic kidney disease    HX OF KIDNEY STONES   . Diabetes mellitus ([place])   . ED (erectile dysfunction)   . GERD (gastroesophageal reflux disease)   . History of DVT (deep vein thrombosis) 2016   Provoked by surgery; moved to pulm  . Hyperlipidemia   . Hypertension   . Hypothyroid   . Nephrolithiasis   . OSA (obstructive sleep apnea)   . Peripheral vascular  disease ([place])    left leg circulaton problem per pt   . Rosacea   . Vitamin B 12 deficiency    Past Surgical History:  Procedure Laterality Date  . APPENDECTOMY    . EYE SURGERY Left 07/19/2019   Cataract removal  . HERNIA REPAIR     UMBILICAL   . LITHOTRIPSY     X 4  . OTHER SURGICAL HISTORY     TUMOR REMOVED RIGHT FACE - BENIGN   . OTHER SURGICAL HISTORY     small facial tumor removal 1970s  . TOTAL KNEE ARTHROPLASTY  06/24/2011   Procedure: TOTAL KNEE ARTHROPLASTY;  Surgeon: Mauri Pole;  Location: [facility];  Service: Orthopedics;  Laterality: Right;  . TOTAL KNEE ARTHROPLASTY  09/03/2011   Procedure: TOTAL KNEE ARTHROPLASTY;  Surgeon: Mauri Pole, MD;  Location: [facility];  Service: Orthopedics;  Laterality: Left;    Allergies  Allergen Reactions  . Methocarbamol [Methocarbamol] Nausea And Vomiting    Social History   Socioeconomic History  . Marital status: Married    Spouse name: Romie Minus  . Number of children: 2  . Years of education: 30  . Highest education level: Not on file  Occupational History  . Not on file  Tobacco Use  . Smoking status: Never Smoker  . Smokeless tobacco: Never Used  Vaping Use  . Vaping Use: Never used  Substance and Sexual  Activity  . Alcohol use: No  . Drug use: No  . Sexual activity: Yes    Partners: Female  Other Topics Concern  . Not on file  Social History Narrative   Marital Status: Married Romie Minus)    Children:  Daughters Caren Griffins, Santiago Glad)    Pets:  Dog (1)    Living Situation: Lives with spouse.   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 Alzheimer's disease Mother   . CAD Mother   . Heart disease Mother   . Lung disease Father 65       Pulmonary Embolus  . Diabetes Maternal Grandfather   . Heart disease Paternal Grandfather   . Thyroid disease Daughter      Review of Systems Constitutional: negative for anorexia, fevers and sweats  Eyes: negative for irritation, redness and visual disturbance  Ears, nose, mouth, throat, and face: negative for earaches, epistaxis, nasal congestion and sore throat  Respiratory: negative for cough,  sputum and wheezing  Cardiovascular: negative for chest pain,  lower extremity edema, orthopnea, palpitations and syncope  Gastrointestinal: negative for abdominal pain, constipation, diarrhea, melena, nausea and vomiting  Genitourinary:negative for dysuria, frequency and hematuria  Hematologic/lymphatic: negative for bleeding, easy bruising and lymphadenopathy  Musculoskeletal:negative for arthralgias, muscle weakness and stiff joints  Neurological: negative for coordination problems, gait problems, headaches and weakness  Endocrine: negative for diabetic symptoms including polydipsia, polyuria and weight loss     Objective:   Physical Exam  Gen. Pleasant, obese, in no distress, normal affect ENT - no pallor,icterus, no post nasal drip, class 2-3 airway Neck: No JVD, no thyromegaly, no carotid bruits Lungs: no use of accessory muscles, no dullness to percussion, bibasal dry rales no rhonchi  Cardiovascular: Rhythm regular, heart sounds  normal, no murmurs or gallops, no peripheral edema Abdomen: soft and non-tender, no hepatosplenomegaly, BS normal. Musculoskeletal: No deformities, no cyanosis or clubbing Neuro:  alert, non focal, no tremors       Assessment & Plan:

## 2020-10-04 NOTE — Telephone Encounter (Signed)
Called and went over xray results per Dr Elsworth Soho with patient. All questions answered and patient expressed full understanding of results and agreeable with Dr Bari Mantis recommendations for HRCT. Order for HRCT was already placed by Dr Elsworth Soho. Nothing further needed at this time.

## 2020-10-13 ENCOUNTER — Other Ambulatory Visit: Payer: Self-pay

## 2020-10-13 ENCOUNTER — Other Ambulatory Visit: Payer: PPO

## 2020-10-13 ENCOUNTER — Ambulatory Visit (INDEPENDENT_AMBULATORY_CARE_PROVIDER_SITE_OTHER)
Admission: RE | Admit: 2020-10-13 | Discharge: 2020-10-13 | Disposition: A | Discharge status: Home / Self Care | Payer: PPO | Source: Ambulatory Visit | Attending: Pulmonary Disease | Admitting: Pulmonary Disease

## 2020-10-13 DIAGNOSIS — R0602 Shortness of breath: Secondary | ICD-10-CM

## 2020-10-13 DIAGNOSIS — J849 Interstitial pulmonary disease, unspecified: Secondary | ICD-10-CM | POA: Diagnosis not present

## 2020-10-17 ENCOUNTER — Telehealth: Payer: Self-pay | Admitting: Pulmonary Disease

## 2020-10-17 NOTE — Telephone Encounter (Signed)
Called and left message on voicemail to please return phone call to. Contact number provided.

## 2020-10-17 NOTE — Progress Notes (Signed)
Patient returned phone call, went over CT results. See telephone note from today (10/17/20)

## 2020-10-17 NOTE — Telephone Encounter (Signed)
Called and spoke with patient who returned phone call to go over CT results. Writer went over CT results per Dr Elsworth Soho with patient. All questions answered and patient expressed full understanding. Nothing further needed at this time.

## 2020-11-24 ENCOUNTER — Other Ambulatory Visit: Payer: Self-pay | Admitting: Family Medicine

## 2020-11-24 DIAGNOSIS — E785 Hyperlipidemia, unspecified: Secondary | ICD-10-CM

## 2020-12-01 ENCOUNTER — Other Ambulatory Visit: Payer: Self-pay | Admitting: Family Medicine

## 2020-12-01 DIAGNOSIS — R7301 Impaired fasting glucose: Secondary | ICD-10-CM

## 2020-12-30 ENCOUNTER — Other Ambulatory Visit: Payer: Self-pay | Admitting: Family Medicine

## 2021-01-18 ENCOUNTER — Ambulatory Visit (INDEPENDENT_AMBULATORY_CARE_PROVIDER_SITE_OTHER): Payer: PPO

## 2021-01-18 VITALS — Ht 68.0 in | Wt 225.0 lb

## 2021-01-18 DIAGNOSIS — Z0000 Encounter for general adult medical examination without abnormal findings: Secondary | ICD-10-CM

## 2021-01-18 NOTE — Progress Notes (Signed)
Subjective:   Brett Armstrong is a 80 y.o. male who presents for Medicare Annual/Subsequent preventive examination.  I connected with Geovani today by telephone and verified that I am speaking with the correct person using two identifiers. Location patient: home Location provider: work Persons participating in the virtual visit: patient, Marine scientist.    I discussed the limitations, risks, security and privacy concerns of performing an evaluation and management service by telephone and the availability of in person appointments. I also discussed with the patient that there may be a patient responsible charge related to this service. The patient expressed understanding and verbally consented to this telephonic visit.    Interactive audio and video telecommunications were attempted between this provider and patient, however failed, due to patient having technical difficulties OR patient did not have access to video capability.  We continued and completed visit with audio only.  Some vital signs may be absent or patient reported.   Time Spent with patient on telephone encounter: 25 minutes  Review of Systems     Cardiac Risk Factors include: male gender;advanced age (>80mn, >>22women);diabetes mellitus;dyslipidemia;hypertension;obesity (BMI >30kg/m2)     Objective:    Today's Vitals   01/18/21 1540  Weight: 225 lb (102.1 kg)  Height: '5\' 8"'$  (1.727 m)   Body mass index is 34.21 kg/m.  Advanced Directives 01/18/2021 08/24/2019 09/03/2011 08/27/2011 06/24/2011 06/19/2011  Does Patient Have a Medical Advance Directive? Yes Yes Patient has advance directive, copy not in chart Patient has advance directive, copy in chart Patient has advance directive, copy not in chart Patient does not have advance directive;Patient has advance directive, copy not in chart  Type of Advance Directive [place] will - [place] will  Healthcare Power of [place] Does patient want to make changes to medical advance directive? - Yes (MAU/Ambulatory/Procedural Areas - Information given) - - - -  Copy of [place] Chart? Yes - validated most recent copy scanned in chart (See row information) Yes - validated most recent copy scanned in chart (See row information) - - - -  Pre-existing out of facility DNR order (yellow form or pink MOST form) - - No No No No    Current Medications (verified) Outpatient Encounter Medications as of 01/18/2021  Medication Sig   aspirin EC 325 MG tablet Take 1 tablet (325 mg total) by mouth 2 (two) times daily. (Patient taking differently: Take 325 mg by mouth once.)   carvedilol (COREG) 6.25 MG tablet TAKE ONE TABLET BY MOUTH TWICE A DAY WITH MEALS   Cholecalciferol (VITAMIN D) 50 MCG (2000 UT) CAPS Take 1 capsule by mouth daily.   levothyroxine (SYNTHROID) 88 MCG tablet TAKE ONE TABLET BY MOUTH DAILY BEFORE BREAKFAST   losartan-hydrochlorothiazide (HYZAAR) 100-25 MG tablet TAKE ONE TABLET BY MOUTH EVERY MORNING   metFORMIN (GLUCOPHAGE-XR) 500 MG 24 hr tablet TAKE TWO TABLETS BY MOUTH EVERY EVENING   Omega-3 Fatty Acids (FISH OIL) 1200 MG CAPS Take by mouth.   pravastatin (PRAVACHOL) 40 MG tablet TAKE ONE TABLET BY MOUTH EVERY NIGHT AT BEDTIME   vitamin B-12 (CYANOCOBALAMIN) 1000 MCG tablet Take 1,000 mcg by mouth daily.   omeprazole (PRILOSEC) 20 MG capsule Take 1 capsule (20 mg total) by mouth 2 (two) times daily as needed (reflux).   No facility-administered encounter medications on file as of 01/18/2021.    Allergies (verified) Methocarbamol [methocarbamol]   History: Past Medical History:  Diagnosis Date   Arthritis    KNEES, HANDS    Chronic kidney disease    HX OF KIDNEY STONES    Diabetes mellitus ([place])    ED (erectile dysfunction)    GERD (gastroesophageal reflux disease)    History of DVT (deep vein thrombosis) 2016   Provoked  by surgery; moved to pulm   Hyperlipidemia    Hypertension    Hypothyroid    Nephrolithiasis    OSA (obstructive sleep apnea)    Peripheral vascular disease ([place])    left leg circulaton problem per pt    Rosacea    Vitamin B 12 deficiency    Past Surgical History:  Procedure Laterality Date   APPENDECTOMY     EYE SURGERY Left 07/19/2019   Cataract removal   HERNIA REPAIR     UMBILICAL    LITHOTRIPSY     X 4   OTHER SURGICAL HISTORY     TUMOR REMOVED RIGHT FACE - BENIGN    OTHER SURGICAL HISTORY     small facial tumor removal 1970s   TOTAL KNEE ARTHROPLASTY  06/24/2011   Procedure: TOTAL KNEE ARTHROPLASTY;  Surgeon: Mauri Pole;  Location: [facility];  Service: Orthopedics;  Laterality: Right;   TOTAL KNEE ARTHROPLASTY  09/03/2011   Procedure: TOTAL KNEE ARTHROPLASTY;  Surgeon: Mauri Pole, MD;  Location: [facility];  Service: Orthopedics;  Laterality: Left;   Family History  Problem Relation Age of Onset   Alzheimer's disease Mother    CAD Mother    Heart disease Mother    Lung disease Father 42       Pulmonary Embolus   Diabetes Maternal Grandfather    Heart disease Paternal Grandfather    Thyroid disease Daughter    Social History   Socioeconomic History   Marital status: Married    Spouse name: Romie Minus   Number of children: 2   Years of education: 12   Highest education level: Not on file  Occupational History   Not on file  Tobacco Use   Smoking status: Never   Smokeless tobacco: Never  Vaping Use   Vaping Use: Never used  Substance and Sexual Activity   Alcohol use: No   Drug use: No   Sexual activity: Yes    Partners: Female  Other Topics Concern   Not on file  Social History Narrative   Marital Status: Married Administrator)    Children:  Daughters Caren Griffins, Santiago Glad)    Pets:  Dog (1)    Living Situation: Lives with spouse.   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[place])    Education:  Programmer, systems    Tobacco Use/Exposure:  None    Alcohol Use:   Occasional   Drug Use:  None   Diet:  Regular   Exercise:  Active Job   Hobbies:  Location manager, Insurance underwriter    Social Determinants of Radio broadcast assistant Strain: Low Risk    Difficulty of Paying Living Expenses: Not hard at all  Food Insecurity: No Food Insecurity   Worried About Charity fundraiser in the Last Year: Never true   Arboriculturist in the Last Year: Never true  Transportation Needs: No Transportation Needs   Lack of Transportation (Medical): No   Lack of Transportation (Non-Medical): No  Physical Activity: Sufficiently Active   Days of Exercise per Week: 7 days   Minutes of Exercise per Session: 30 min  Stress: No Stress Concern Present  Feeling of Stress : Not at all  Social Connections: Moderately Isolated   Frequency of Communication with Friends and Family: More than three times a week   Frequency of Social Gatherings with Friends and Family: More than three times a week   Attends Religious Services: Never   Marine scientist or Organizations: No   Attends Music therapist: Never   Marital Status: Married    Tobacco Counseling Counseling given: Not Answered   Clinical Intake:  Pre-visit preparation completed: Yes  Pain : No/denies pain     Nutritional Status: BMI > 30  Obese Nutritional Risks: None Diabetes: Yes CBG done?: No Did pt. bring in CBG monitor from home?: No (phone visit)  How often do you need to have someone help you when you read instructions, pamphlets, or other written materials from your doctor or pharmacy?: 1 - Never  Diabetes:  Is the patient diabetic?  Yes  If diabetic, was a CBG obtained today?  No  Did the patient bring in their glucometer from home?  No phone visit How often do you monitor your CBG's? never.   Financial Strains and Diabetes Management:  Are you having any financial strains with the device, your supplies or your medication? No .  Does the patient want to be seen by Chronic Care  Management for management of their diabetes?  No  Would the patient like to be referred to a Nutritionist or for Diabetic Management?  No   Diabetic Exams:  Diabetic Eye Exam: . Overdue for diabetic eye exam. Pt has been advised about the importance in completing this exam. Patient has an upcoming appt  Diabetic Foot Exam: Pt has been advised about the importance in completing this exam. To be completed by PCP.    Interpreter Needed?: No  Information entered by :: Caroleen Hamman LPN   Activities of Daily Living In your present state of health, do you have any difficulty performing the following activities: 01/18/2021 09/27/2020  Hearing? N N  Vision? N N  Difficulty concentrating or making decisions? N N  Walking or climbing stairs? N N  Dressing or bathing? N N  Doing errands, shopping? N N  Preparing Food and eating ? N -  Using the Toilet? N -  In the past six months, have you accidently leaked urine? N -  Do you have problems with loss of bowel control? N -  Managing your Medications? N -  Managing your Finances? N -  Housekeeping or managing your Housekeeping? N -  Some recent data might be hidden    Patient Care Team: [name], DO as PCP - General (Family Medicine)  Indicate any recent Medical Services you may have received from other than [facility] providers in the past year (date may be approximate).     Assessment:   This is a routine wellness examination for [place].  Hearing/Vision screen Hearing Screening - Comments:: C/o mild hearing loss Vision Screening - Comments:: Last eye exam-2 years Reading glasses  Dietary issues and exercise activities discussed: Current Exercise Habits: Home exercise routine, Type of exercise: walking;strength training/weights, Time (Minutes): 30, Frequency (Times/Week): 7, Weekly Exercise (Minutes/Week): 210, Intensity: Mild, Exercise limited by: None identified   Goals Addressed             This Visit's Progress     Patient Stated   On track    Drink more water       Depression Screen [facility] 2/9 Scores 01/18/2021 09/27/2020  08/24/2019 02/23/2013  PHQ - 2 Score 0 0 0 0    Fall Risk Fall Risk  01/18/2021 08/24/2019 02/23/2013  Falls in the past year? 0 0 No  Number falls in past yr: 0 0 -  Injury with Fall? 0 0 -  Follow up Falls prevention discussed Education provided;Falls prevention discussed -    FALL RISK PREVENTION PERTAINING TO THE HOME:  Any stairs in or around the home? Yes  If so, are there any without handrails? No  Home free of loose throw rugs in walkways, pet beds, electrical cords, etc? Yes  Adequate lighting in your home to reduce risk of falls? Yes   ASSISTIVE DEVICES UTILIZED TO PREVENT FALLS:  Life alert? No  Use of a cane, walker or w/c? No  Grab bars in the bathroom? Yes  Shower chair or bench in shower? No  Elevated toilet seat or a handicapped toilet? No   TIMED UP AND GO:  Was the test performed? No . Phone visit   Cognitive Function:Normal cognitive status assessed by this Nurse Health Advisor. No abnormalities found.          Immunizations Immunization History  Administered Date(s) Administered   Fluad Quad(high Dose 65+) 02/25/2019, 03/15/2020   Influenza, High Dose Seasonal PF 02/23/2013, 03/07/2015, 02/28/2016, 02/26/2017, 03/04/2018   Influenza,inj,Quad PF,6+ Mos 02/23/2013, 03/07/2015   Influenza-Unspecified 02/23/2013, 02/28/2016   PFIZER(Purple Top)SARS-COV-2 Vaccination 07/23/2019, 08/13/2019, 02/12/2020   Pneumococcal Conjugate-13 06/01/2009, 04/22/2014, 04/22/2014   Pneumococcal Polysaccharide-23 06/01/2009, 06/01/2009   Td 02/26/2005   Tdap 02/26/2005, 12/30/2011   Zoster Recombinat (Shingrix) 08/29/2017, 12/31/2017   Zoster, Live 06/01/2009    TDAP status: Up to date  Flu Vaccine status: Up to date  Pneumococcal vaccine status: Up to date  Covid-19 vaccine status: Information provided on how to obtain vaccines. Booster due  Qualifies for  Shingles Vaccine? No   Zostavax completed Yes   Shingrix Completed?: Yes  Screening Tests Health Maintenance  Topic Date Due   COVID-19 Vaccine (4 - Booster for Pfizer series) 06/13/2020   OPHTHALMOLOGY EXAM  06/30/2020   FOOT EXAM  08/24/2020   INFLUENZA VACCINE  01/15/2021   HEMOGLOBIN A1C  02/23/2021   TETANUS/TDAP  12/29/2021   Hepatitis C Screening  Completed   PNA vac Low Risk Adult  Completed   Zoster Vaccines- Shingrix  Completed   HPV VACCINES  Aged Out    Health Maintenance  Health Maintenance Due  Topic Date Due   COVID-19 Vaccine (4 - Booster for Pfizer series) 06/13/2020   OPHTHALMOLOGY EXAM  06/30/2020   FOOT EXAM  08/24/2020   INFLUENZA VACCINE  01/15/2021    Colorectal cancer screening: No longer required.   Lung Cancer Screening: (Low Dose CT Chest recommended if Age 49-80 years, 30 pack-year currently smoking OR have quit w/in 15years.) does not qualify.     Additional Screening:  Hepatitis C Screening: does not qualify  Vision Screening: Recommended annual ophthalmology exams for early detection of glaucoma and other disorders of the eye. Is the patient up to date with their annual eye exam?  No  Patient has an upcoming appt  Dental Screening: Recommended annual dental exams for proper oral hygiene  Community Resource Referral / Chronic Care Management: CRR required this visit?  No   CCM required this visit?  No      Plan:     I have personally reviewed and noted the following in the patient's chart:   Medical and social history Use of alcohol,  tobacco or illicit drugs  Current medications and supplements including opioid prescriptions. Patient is not currently taking opioid prescriptions. Functional ability and status Nutritional status Physical activity Advanced directives List of other physicians Hospitalizations, surgeries, and ER visits in previous 12 months Vitals Screenings to include cognitive, depression, and  falls Referrals and appointments  In addition, I have reviewed and discussed with patient certain preventive protocols, quality metrics, and best practice recommendations. A written personalized care plan for preventive services as well as general preventive health recommendations were provided to patient.   Due to this being a telephonic visit, the after visit summary with patients personalized plan was offered to patient via mail or my-chart. Patient would like to access on my-chart.   Marta Antu, LPN   [id number]  Nurse Health Advisor  Nurse Notes: None

## 2021-01-18 NOTE — Patient Instructions (Signed)
Mr. Brett Armstrong , Thank you for taking time to complete your Medicare Wellness Visit. I appreciate your ongoing commitment to your health goals. Please review the following plan we discussed and let me know if I can assist you in the future.   Screening recommendations/referrals: Colonoscopy: No longer required Recommended yearly ophthalmology/optometry visit for glaucoma screening and checkup Recommended yearly dental visit for hygiene and checkup  Vaccinations: Influenza vaccine: Up to date Pneumococcal vaccine: Up to date Tdap vaccine: Up to date-Due-12/29/2021 Shingles vaccine: Completed vaccines   Covid-19: Per our conversation, you will be getting your 2nd booster tomorrow.  Advanced directives: Copy in chart  Conditions/risks identified: See problem list  Next appointment: Follow up in one year for your annual wellness visit. 01/24/2022 @ 3:40  Preventive Care 65 Years and Older, Male Preventive care refers to lifestyle choices and visits with your health care provider that can promote health and wellness. What does preventive care include? A yearly physical exam. This is also called an annual well check. Dental exams once or twice a year. Routine eye exams. Ask your health care provider how often you should have your eyes checked. Personal lifestyle choices, including: Daily care of your teeth and gums. Regular physical activity. Eating a healthy diet. Avoiding tobacco and drug use. Limiting alcohol use. Practicing safe sex. Taking low doses of aspirin every day. Taking vitamin and mineral supplements as recommended by your health care provider. What happens during an annual well check? The services and screenings done by your health care provider during your annual well check will depend on your age, overall health, lifestyle risk factors, and family history of disease. Counseling  Your health care provider may ask you questions about your: Alcohol use. Tobacco use. Drug  use. Emotional well-being. Home and relationship well-being. Sexual activity. Eating habits. History of falls. Memory and ability to understand (cognition). Work and work Statistician. Screening  You may have the following tests or measurements: Height, weight, and BMI. Blood pressure. Lipid and cholesterol levels. These may be checked every 5 years, or more frequently if you are over 64 years old. Skin check. Lung cancer screening. You may have this screening every year starting at age 49 if you have a 30-pack-year history of smoking and currently smoke or have quit within the past 15 years. Fecal occult blood test (FOBT) of the stool. You may have this test every year starting at age 62. Flexible sigmoidoscopy or colonoscopy. You may have a sigmoidoscopy every 5 years or a colonoscopy every 10 years starting at age 12. Prostate cancer screening. Recommendations will vary depending on your family history and other risks. Hepatitis C blood test. Hepatitis B blood test. Sexually transmitted disease (STD) testing. Diabetes screening. This is done by checking your blood sugar (glucose) after you have not eaten for a while (fasting). You may have this done every 1-3 years. Abdominal aortic aneurysm (AAA) screening. You may need this if you are a current or former smoker. Osteoporosis. You may be screened starting at age 15 if you are at high risk. Talk with your health care provider about your test results, treatment options, and if necessary, the need for more tests. Vaccines  Your health care provider may recommend certain vaccines, such as: Influenza vaccine. This is recommended every year. Tetanus, diphtheria, and acellular pertussis (Tdap, Td) vaccine. You may need a Td booster every 10 years. Zoster vaccine. You may need this after age 5. Pneumococcal 13-valent conjugate (PCV13) vaccine. One dose is recommended after age  78. Pneumococcal polysaccharide (PPSV23) vaccine. One dose is  recommended after age 70. Talk to your health care provider about which screenings and vaccines you need and how often you need them. This information is not intended to replace advice given to you by your health care provider. Make sure you discuss any questions you have with your health care provider. Document Released: 06/30/2015 Document Revised: 02/21/2016 Document Reviewed: 04/04/2015 Elsevier Interactive Patient Education  2017 [place] Prevention in the Home Falls can cause injuries. They can happen to people of all ages. There are many things you can do to make your home safe and to help prevent falls. What can I do on the outside of my home? Regularly fix the edges of walkways and driveways and fix any cracks. Remove anything that might make you trip as you walk through a door, such as a raised step or threshold. Trim any bushes or trees on the path to your home. Use bright outdoor lighting. Clear any walking paths of anything that might make someone trip, such as rocks or tools. Regularly check to see if handrails are loose or broken. Make sure that both sides of any steps have handrails. Any raised decks and porches should have guardrails on the edges. Have any leaves, snow, or ice cleared regularly. Use sand or salt on walking paths during winter. Clean up any spills in your garage right away. This includes oil or grease spills. What can I do in the bathroom? Use night lights. Install grab bars by the toilet and in the tub and shower. Do not use towel bars as grab bars. Use non-skid mats or decals in the tub or shower. If you need to sit down in the shower, use a plastic, non-slip stool. Keep the floor dry. Clean up any water that spills on the floor as soon as it happens. Remove soap buildup in the tub or shower regularly. Attach bath mats securely with double-sided non-slip rug tape. Do not have throw rugs and other things on the floor that can make you  trip. What can I do in the bedroom? Use night lights. Make sure that you have a light by your bed that is easy to reach. Do not use any sheets or blankets that are too big for your bed. They should not hang down onto the floor. Have a firm chair that has side arms. You can use this for support while you get dressed. Do not have throw rugs and other things on the floor that can make you trip. What can I do in the kitchen? Clean up any spills right away. Avoid walking on wet floors. Keep items that you use a lot in easy-to-reach places. If you need to reach something above you, use a strong step stool that has a grab bar. Keep electrical cords out of the way. Do not use floor polish or wax that makes floors slippery. If you must use wax, use non-skid floor wax. Do not have throw rugs and other things on the floor that can make you trip. What can I do with my stairs? Do not leave any items on the stairs. Make sure that there are handrails on both sides of the stairs and use them. Fix handrails that are broken or loose. Make sure that handrails are as long as the stairways. Check any carpeting to make sure that it is firmly attached to the stairs. Fix any carpet that is loose or worn. Avoid having throw rugs at  the top or bottom of the stairs. If you do have throw rugs, attach them to the floor with carpet tape. Make sure that you have a light switch at the top of the stairs and the bottom of the stairs. If you do not have them, ask someone to add them for you. What else can I do to help prevent falls? Wear shoes that: Do not have high heels. Have rubber bottoms. Are comfortable and fit you well. Are closed at the toe. Do not wear sandals. If you use a stepladder: Make sure that it is fully opened. Do not climb a closed stepladder. Make sure that both sides of the stepladder are locked into place. Ask someone to hold it for you, if possible. Clearly mark and make sure that you can  see: Any grab bars or handrails. First and last steps. Where the edge of each step is. Use tools that help you move around (mobility aids) if they are needed. These include: Canes. Walkers. Scooters. Crutches. Turn on the lights when you go into a dark area. Replace any light bulbs as soon as they burn out. Set up your furniture so you have a clear path. Avoid moving your furniture around. If any of your floors are uneven, fix them. If there are any pets around you, be aware of where they are. Review your medicines with your doctor. Some medicines can make you feel dizzy. This can increase your chance of falling. Ask your doctor what other things that you can do to help prevent falls. This information is not intended to replace advice given to you by your health care provider. Make sure you discuss any questions you have with your health care provider. Document Released: 03/30/2009 Document Revised: 11/09/2015 Document Reviewed: 07/08/2014 Elsevier Interactive Patient Education  2017 [employer].

## 2021-02-07 ENCOUNTER — Other Ambulatory Visit: Payer: Self-pay | Admitting: Family Medicine

## 2021-04-04 ENCOUNTER — Encounter: Payer: Self-pay | Admitting: Family Medicine

## 2021-04-04 ENCOUNTER — Ambulatory Visit (INDEPENDENT_AMBULATORY_CARE_PROVIDER_SITE_OTHER): Payer: PPO | Admitting: Family Medicine

## 2021-04-04 ENCOUNTER — Other Ambulatory Visit: Payer: Self-pay

## 2021-04-04 VITALS — BP 136/78 | HR 54 | Temp 97.9°F | Ht 68.0 in | Wt 226.1 lb

## 2021-04-04 DIAGNOSIS — E669 Obesity, unspecified: Secondary | ICD-10-CM | POA: Diagnosis not present

## 2021-04-04 DIAGNOSIS — Z23 Encounter for immunization: Secondary | ICD-10-CM

## 2021-04-04 DIAGNOSIS — I10 Essential (primary) hypertension: Secondary | ICD-10-CM

## 2021-04-04 DIAGNOSIS — E1169 Type 2 diabetes mellitus with other specified complication: Secondary | ICD-10-CM | POA: Diagnosis not present

## 2021-04-04 DIAGNOSIS — E7800 Pure hypercholesterolemia, unspecified: Secondary | ICD-10-CM

## 2021-04-04 LAB — COMPREHENSIVE METABOLIC PANEL
ALT: 24 U/L (ref 0–53)
AST: 22 U/L (ref 0–37)
Albumin: 4.6 g/dL (ref 3.5–5.2)
Alkaline Phosphatase: 60 U/L (ref 39–117)
BUN: 16 mg/dL (ref 6–23)
CO2: 35 mEq/L — ABNORMAL HIGH (ref 19–32)
Calcium: 9.7 mg/dL (ref 8.4–10.5)
Chloride: 99 mEq/L (ref 96–112)
Creatinine, Ser: 1.02 mg/dL (ref 0.40–1.50)
GFR: 70.63 mL/min (ref >60.00)
Glucose, Bld: 106 mg/dL — ABNORMAL HIGH (ref 70–99)
Potassium: 4.2 mEq/L (ref 3.5–5.1)
Sodium: 140 mEq/L (ref 135–145)
Total Bilirubin: 0.8 mg/dL (ref 0.2–1.2)
Total Protein: 7.3 g/dL (ref 6.0–8.3)

## 2021-04-04 LAB — LIPID PANEL
Cholesterol: 139 mg/dL (ref 0–200)
HDL: 42.6 mg/dL (ref >39.00)
LDL Cholesterol: 69 mg/dL (ref 0–99)
NonHDL: 96.43
Total CHOL/HDL Ratio: 3
Triglycerides: 136 mg/dL (ref 0.0–149.0)
VLDL: 27.2 mg/dL (ref 0.0–40.0)

## 2021-04-04 LAB — HEMOGLOBIN A1C: Hgb A1c MFr Bld: 6.5 % (ref 4.6–6.5)

## 2021-04-04 MED ORDER — ASPIRIN EC 325 MG PO TBEC: 325.0000 mg | DELAYED_RELEASE_TABLET | Freq: Once | ORAL | Status: AC

## 2021-04-04 NOTE — Patient Instructions (Addendum)
Give [place] 2-3 business days to get the results of your labs back.   Keep the diet clean and stay active.  I recommend getting the updated bivalent covid vaccination booster at your convenience.   Let us know if you need anything.

## 2021-04-04 NOTE — Progress Notes (Signed)
Subjective:   Chief Complaint  Patient presents with   Follow-up    Brett Armstrong is a 79 y.o. male here for follow-up of diabetes.   Brett Armstrong does not routinely check his sugars.  Patient does not require insulin.   Medications include: metformin XR 1000 mg/d Diet is fair.  Exercise: walking  Hypertension Patient presents for hypertension follow up. He does not monitor home blood pressures. He is compliant with medications- Hyzaar 100-25 mg/d, Coreg 6.25 mg bid. Patient has these side effects of medication: none Diet/exercise as above.  Hyperlipidemia Patient presents for hyperlipidemia follow up. Currently being treated with pravastatin 40 mg/d and compliance with treatment thus far has been good. He complains of myalgias. Diet/exercise as above.  The patient is not known to have coexisting coronary artery disease.  Past Medical History:  Diagnosis Date   Arthritis    KNEES, HANDS    Chronic kidney disease    HX OF KIDNEY STONES    Diabetes mellitus (HCC)    ED (erectile dysfunction)    GERD (gastroesophageal reflux disease)    History of DVT (deep vein thrombosis) 2016   Provoked by surgery; moved to pulm   Hyperlipidemia    Hypertension    Hypothyroid    Nephrolithiasis    OSA (obstructive sleep apnea)    Peripheral vascular disease (HCC)    left leg circulaton problem per pt    Rosacea    Vitamin B 12 deficiency      Related testing: Retinal exam: Done Pneumovax: done  Objective:  BP 136/78   Pulse (!) 54   Temp 97.9 F (36.6 C) (Oral)   Ht 5\' 8"  (1.727 m)   Wt 226 lb 2 oz (102.6 kg)   SpO2 99%   BMI 34.38 kg/m  General:  Well developed, well nourished, in no apparent distress Skin:  Warm, no pallor or diaphoresis Head:  Normocephalic, atraumatic Eyes:  Pupils equal and round, sclera anicteric without injection  Lungs:  CTAB, no access msc use Cardio:  Reg rhythem, bradycardic, no bruits, no LE edema Musculoskeletal:  Symmetrical muscle  groups noted without atrophy or deformity Neuro:  Sensation intact to pinprick on feet Psych: Age appropriate judgment and insight  Assessment:   Diabetes mellitus type 2 in obese ([place]) - Plan: Comprehensive metabolic panel, Lipid panel, Hemoglobin A1c  Essential hypertension, benign  Pure hypercholesterolemia   Plan:   Chronic, stable. Cont Metformin XR 1000 mg/d. Counseled on diet and exercise. Chronic, stable. Cont Hyzaar 100-25 mg/d, Coreg 6.25 mg bid. Chronic, stable. Cont pravastatin 40 mg/d. Ck labs.  Bivalent covid booster vaccine rec'd.  Flu shot today.  F/u in 6 mo. The patient voiced understanding and agreement to the plan.  [place], DO 04/04/21 7:46 AM

## 2021-04-04 NOTE — Addendum Note (Signed)
Addended by: Sharon Seller B on: 04/04/2021 07:54 AM   Modules accepted: Orders

## 2021-04-30 ENCOUNTER — Other Ambulatory Visit: Payer: Self-pay | Admitting: Family Medicine

## 2021-04-30 DIAGNOSIS — I10 Essential (primary) hypertension: Secondary | ICD-10-CM

## 2021-04-30 DIAGNOSIS — R7301 Impaired fasting glucose: Secondary | ICD-10-CM

## 2021-05-28 ENCOUNTER — Other Ambulatory Visit: Payer: Self-pay | Admitting: Family Medicine

## 2021-05-28 DIAGNOSIS — R7301 Impaired fasting glucose: Secondary | ICD-10-CM

## 2021-06-26 ENCOUNTER — Other Ambulatory Visit: Payer: Self-pay | Admitting: Family Medicine

## 2021-06-26 DIAGNOSIS — R7301 Impaired fasting glucose: Secondary | ICD-10-CM

## 2021-06-26 MED ORDER — METFORMIN HCL ER 500 MG PO TB24: 1000.0000 mg | ORAL_TABLET | Freq: Every evening | ORAL | 3 refills | Status: DC

## 2021-07-10 DIAGNOSIS — L820 Inflamed seborrheic keratosis: Secondary | ICD-10-CM | POA: Diagnosis not present

## 2021-07-10 DIAGNOSIS — L814 Other melanin hyperpigmentation: Secondary | ICD-10-CM | POA: Diagnosis not present

## 2021-07-10 DIAGNOSIS — L578 Other skin changes due to chronic exposure to nonionizing radiation: Secondary | ICD-10-CM | POA: Diagnosis not present

## 2021-07-10 DIAGNOSIS — D1801 Hemangioma of skin and subcutaneous tissue: Secondary | ICD-10-CM | POA: Diagnosis not present

## 2021-07-10 DIAGNOSIS — X32XXXS Exposure to sunlight, sequela: Secondary | ICD-10-CM | POA: Diagnosis not present

## 2021-07-10 DIAGNOSIS — L821 Other seborrheic keratosis: Secondary | ICD-10-CM | POA: Diagnosis not present

## 2021-07-10 DIAGNOSIS — L570 Actinic keratosis: Secondary | ICD-10-CM | POA: Diagnosis not present

## 2021-07-25 ENCOUNTER — Other Ambulatory Visit: Payer: Self-pay | Admitting: Family Medicine

## 2021-07-25 DIAGNOSIS — E785 Hyperlipidemia, unspecified: Secondary | ICD-10-CM

## 2021-09-24 ENCOUNTER — Other Ambulatory Visit: Payer: Self-pay | Admitting: Family Medicine

## 2021-10-03 ENCOUNTER — Ambulatory Visit (INDEPENDENT_AMBULATORY_CARE_PROVIDER_SITE_OTHER): Payer: PPO | Admitting: Family Medicine

## 2021-10-03 ENCOUNTER — Encounter: Payer: Self-pay | Admitting: Family Medicine

## 2021-10-03 VITALS — BP 122/73 | HR 63 | Temp 97.4°F | Ht 68.0 in | Wt 223.5 lb

## 2021-10-03 DIAGNOSIS — E1169 Type 2 diabetes mellitus with other specified complication: Secondary | ICD-10-CM

## 2021-10-03 DIAGNOSIS — I10 Essential (primary) hypertension: Secondary | ICD-10-CM

## 2021-10-03 DIAGNOSIS — E669 Obesity, unspecified: Secondary | ICD-10-CM | POA: Diagnosis not present

## 2021-10-03 DIAGNOSIS — E039 Hypothyroidism, unspecified: Secondary | ICD-10-CM | POA: Diagnosis not present

## 2021-10-03 DIAGNOSIS — Z0000 Encounter for general adult medical examination without abnormal findings: Secondary | ICD-10-CM

## 2021-10-03 DIAGNOSIS — E7800 Pure hypercholesterolemia, unspecified: Secondary | ICD-10-CM | POA: Diagnosis not present

## 2021-10-03 LAB — CBC
HCT: 45.6 % (ref 39.0–52.0)
Hemoglobin: 15.3 g/dL (ref 13.0–17.0)
MCHC: 33.6 g/dL (ref 30.0–36.0)
MCV: 95 fl (ref 78.0–100.0)
Platelets: 190 10*3/uL (ref 150.0–400.0)
RBC: 4.8 Mil/uL (ref 4.22–5.81)
RDW: 14 % (ref 11.5–15.5)
WBC: 4.3 10*3/uL (ref 4.0–10.5)

## 2021-10-03 LAB — LIPID PANEL
Cholesterol: 150 mg/dL (ref 0–200)
HDL: 42.8 mg/dL (ref >39.00)
LDL Cholesterol: 79 mg/dL (ref 0–99)
NonHDL: 107.36
Total CHOL/HDL Ratio: 4
Triglycerides: 143 mg/dL (ref 0.0–149.0)
VLDL: 28.6 mg/dL (ref 0.0–40.0)

## 2021-10-03 LAB — COMPREHENSIVE METABOLIC PANEL
ALT: 24 U/L (ref 0–53)
AST: 23 U/L (ref 0–37)
Albumin: 4.6 g/dL (ref 3.5–5.2)
Alkaline Phosphatase: 55 U/L (ref 39–117)
BUN: 16 mg/dL (ref 6–23)
CO2: 35 mEq/L — ABNORMAL HIGH (ref 19–32)
Calcium: 9.7 mg/dL (ref 8.4–10.5)
Chloride: 99 mEq/L (ref 96–112)
Creatinine, Ser: 1.06 mg/dL (ref 0.40–1.50)
GFR: 67.21 mL/min (ref >60.00)
Glucose, Bld: 103 mg/dL — ABNORMAL HIGH (ref 70–99)
Potassium: 4 mEq/L (ref 3.5–5.1)
Sodium: 140 mEq/L (ref 135–145)
Total Bilirubin: 0.8 mg/dL (ref 0.2–1.2)
Total Protein: 7.2 g/dL (ref 6.0–8.3)

## 2021-10-03 LAB — TSH: TSH: 2.57 u[IU]/mL (ref 0.35–5.50)

## 2021-10-03 LAB — MICROALBUMIN / CREATININE URINE RATIO
Creatinine,U: 34.2 mg/dL
Microalb Creat Ratio: 2 mg/g (ref 0.0–30.0)
Microalb, Ur: <0.7 mg/dL (ref 0.0–1.9)

## 2021-10-03 LAB — HEMOGLOBIN A1C: Hgb A1c MFr Bld: 6.6 % — ABNORMAL HIGH (ref 4.6–6.5)

## 2021-10-03 NOTE — Progress Notes (Signed)
Chief Complaint  ?Patient presents with  ? Annual Exam  ? ? ?Well Male ?Brett Armstrong is here for a complete physical.   ?His last physical was >1 year ago.  ?Current diet: in general, a "healthy" diet.   ?Current exercise: walking ?Weight trend: intentionally losing ?Fatigue out of ordinary? No. ?Seat belt? Yes.   ?Advanced directive? Yes; on file ? ?Health maintenance ?Shingrix- Yes ?Tetanus- Yes ?Hep C- Yes ?Pneumonia vaccine- Yes ? ?Past Medical History:  ?Diagnosis Date  ? Arthritis   ? KNEES, HANDS   ? Chronic kidney disease   ? HX OF KIDNEY STONES   ? Diabetes mellitus ([place])   ? ED (erectile dysfunction)   ? GERD (gastroesophageal reflux disease)   ? History of DVT (deep vein thrombosis) 2016  ? Provoked by surgery; moved to pulm  ? Hyperlipidemia   ? Hypertension   ? Hypothyroid   ? Nephrolithiasis   ? OSA (obstructive sleep apnea)   ? Peripheral vascular disease ([place])   ? left leg circulaton problem per pt   ? Rosacea   ? Vitamin B 12 deficiency   ?  ? ?Past Surgical History:  ?Procedure Laterality Date  ? APPENDECTOMY    ? EYE SURGERY Left 07/19/2019  ? Cataract removal  ? HERNIA REPAIR    ? UMBILICAL   ? LITHOTRIPSY    ? X 4  ? OTHER SURGICAL HISTORY    ? TUMOR REMOVED RIGHT FACE - BENIGN   ? OTHER SURGICAL HISTORY    ? small facial tumor removal 1970s  ? TOTAL KNEE ARTHROPLASTY  06/24/2011  ? Procedure: TOTAL KNEE ARTHROPLASTY;  Surgeon: Mauri Pole;  Location: [facility];  Service: Orthopedics;  Laterality: Right;  ? TOTAL KNEE ARTHROPLASTY  09/03/2011  ? Procedure: TOTAL KNEE ARTHROPLASTY;  Surgeon: Mauri Pole, MD;  Location: [facility];  Service: Orthopedics;  Laterality: Left;  ? ? ?Medications  ?Current Outpatient Medications on File Prior to Visit  ?Medication Sig Dispense Refill  ? carvedilol (COREG) 6.25 MG tablet TAKE ONE TABLET BY MOUTH TWICE A DAY WITH MEALS 180 tablet 2  ? Cholecalciferol (VITAMIN D) 50 MCG (2000 UT) CAPS Take 1 capsule by mouth daily.    ? levothyroxine (SYNTHROID) 88 MCG  tablet TAKE ONE TABLET BY MOUTH DAILY BEFORE BREAKFAST 90 tablet 2  ? losartan-hydrochlorothiazide (HYZAAR) 100-25 MG tablet TAKE ONE TABLET BY MOUTH EVERY MORNING 90 tablet 0  ? metFORMIN (GLUCOPHAGE-XR) 500 MG 24 hr tablet Take 2 tablets (1,000 mg total) by mouth every evening. 60 tablet 3  ? Omega-3 Fatty Acids (FISH OIL) 1200 MG CAPS Take by mouth.    ? pravastatin (PRAVACHOL) 40 MG tablet TAKE ONE TABLET BY MOUTH EVERY NIGHT AT BEDTIME 90 tablet 2  ? vitamin B-12 (CYANOCOBALAMIN) 1000 MCG tablet Take 1,000 mcg by mouth daily.    ? omeprazole (PRILOSEC) 20 MG capsule Take 1 capsule (20 mg total) by mouth 2 (two) times daily as needed (reflux). 180 capsule 2  ? ?Allergies ?Allergies  ?Allergen Reactions  ? Methocarbamol [Methocarbamol] Nausea And Vomiting  ? ? ?Family History ?Family History  ?Problem Relation Age of Onset  ? Alzheimer's disease Mother   ? CAD Mother   ? Heart disease Mother   ? Lung disease Father 24  ?     Pulmonary Embolus  ? Diabetes Maternal Grandfather   ? Heart disease Paternal Grandfather   ? Thyroid disease Daughter   ? ? ?Review of Systems: ?Constitutional:  no fevers ?  Eye:  no recent significant change in vision ?Ears:  No recent changes in hearing ?Nose/Mouth/Throat:  no complaints of nasal congestion, no sore throat ?Cardiovascular: no chest pain ?Respiratory:  No shortness of breath ?Gastrointestinal:  No change in bowel habits ?GU:  No frequency ?Integumentary:  no abnormal skin lesions reported ?Neurologic:  no headaches ?Endocrine:  denies unexplained weight changes ? ?Exam ?BP 122/73   Pulse 63   Temp (!) 97.4 ?F (36.3 ?C) (Oral)   Ht '5\' 8"'$  (1.727 m)   Wt 223 lb 8 oz (101.4 kg)   SpO2 98%   BMI 33.98 kg/m?  ?General:  well developed, well nourished, in no apparent distress ?Skin:  no significant moles, warts, or growths ?Head:  no masses, lesions, or tenderness ?Eyes:  pupils equal and round, sclera anicteric without injection ?Ears:  R canal 100% obsctructed w cerumen, L  canal patent and without lesions, TM shiny without retraction, no obvious effusion, no erythema ?Nose:  nares patent, septum midline, mucosa normal ?Throat/Pharynx:  lips and gingiva without lesion; tongue and uvula midline; non-inflamed pharynx; no exudates or postnasal drainage ?Lungs:  clear to auscultation, breath sounds equal bilaterally, no respiratory distress ?Cardio:  regular rate and rhythm, no LE edema or bruits ?Abd: BS+, S, NT, ND, umbilical hernia noted ?Rectal: Deferred ?GI: BS+, S, NT, ND, no masses or organomegaly ?Musculoskeletal:  symmetrical muscle groups noted without atrophy or deformity ?Neuro:  gait normal; deep tendon reflexes normal and symmetric ?Psych: well oriented with normal range of affect and appropriate judgment/insight ? ?Assessment and Plan ? ?Well adult exam ? ?Pure hypercholesterolemia ? ?Diabetes mellitus type 2 in obese ([place]) - Plan: CBC, Comprehensive metabolic panel, Microalbumin / creatinine urine ratio, Lipid panel, Hemoglobin A1c ? ?Essential hypertension, benign ? ?Hypothyroidism, unspecified type - Plan: TSH  ? ?Well 81 y.o. male. ?Counseled on diet and exercise. ?Has stuff to flush ear, rec'd he do this for R ear. ?Other orders as above. ?Follow up in 6 mo.  ?The patient voiced understanding and agreement to the plan. ? ?[name], DO ?10/03/21 ?8:07 AM ? ?

## 2021-10-03 NOTE — Patient Instructions (Signed)
Give us 2-3 business days to get the results of your labs back.   Keep the diet clean and stay active.  Let us know if you need anything. 

## 2021-10-23 ENCOUNTER — Other Ambulatory Visit: Payer: Self-pay | Admitting: Family Medicine

## 2021-10-23 DIAGNOSIS — R7301 Impaired fasting glucose: Secondary | ICD-10-CM

## 2022-01-24 ENCOUNTER — Ambulatory Visit (INDEPENDENT_AMBULATORY_CARE_PROVIDER_SITE_OTHER): Payer: PPO

## 2022-01-24 DIAGNOSIS — Z0000 Encounter for general adult medical examination without abnormal findings: Secondary | ICD-10-CM

## 2022-01-24 NOTE — Progress Notes (Signed)
Subjective:   Brett Armstrong is a 81 y.o. male who presents for Medicare Annual/Subsequent preventive examination.  I connected with  Brett Armstrong on [birth year] by a audio enabled telemedicine application and verified that I am speaking with the correct person using two identifiers.  Patient Location: Home  Provider Location: Office/Clinic  I discussed the limitations of evaluation and management by telemedicine. The patient expressed understanding and agreed to proceed.   Review of Systems     Cardiac Risk Factors include: advanced age (>41mn, >>21women);hypertension;diabetes mellitus;dyslipidemia;obesity (BMI >30kg/m2);male gender     Objective:    There were no vitals filed for this visit. There is no height or weight on file to calculate BMI.     01/24/2022    3:46 PM 01/18/2021    3:45 PM 08/24/2019    3:18 PM 09/03/2011    6:22 PM 08/27/2011    9:48 AM 06/24/2011    3:21 PM 06/19/2011    1:04 PM  Advanced Directives  Does Patient Have a Medical Advance Directive? Yes Yes Yes Patient has advance directive, copy not in chart Patient has advance directive, copy in chart Patient has advance directive, copy not in chart Patient does not have advance directive;Patient has advance directive, copy not in chart  Type of Advance Directive [place] will [place] Lauderd[place] will [place] will  [place] will Healthcare Power of [place] Does patient want to make changes to medical advance directive? No - Patient declined  Yes (MAU/Ambulatory/Procedural Areas - Information given)      Copy of [place] Chart? No - copy requested Yes - validated most recent copy scanned in chart (See row information) Yes - validated most recent copy scanned in chart (See row information)      Pre-existing out of facility DNR order (yellow form or pink MOST form)     No No No No    Current Medications (verified) Outpatient Encounter Medications as of 01/24/2022  Medication Sig   carvedilol (COREG) 6.25 MG tablet TAKE ONE TABLET BY MOUTH TWICE A DAY WITH MEALS   Cholecalciferol (VITAMIN D) 50 MCG (2000 UT) CAPS Take 1 capsule by mouth daily.   levothyroxine (SYNTHROID) 88 MCG tablet TAKE ONE TABLET BY MOUTH DAILY BEFORE BREAKFAST   losartan-hydrochlorothiazide (HYZAAR) 100-25 MG tablet TAKE ONE TABLET BY MOUTH EVERY MORNING   metFORMIN (GLUCOPHAGE-XR) 500 MG 24 hr tablet TAKE TWO TABLETS BY MOUTH EVERY EVENING   Omega-3 Fatty Acids (FISH OIL) 1200 MG CAPS Take by mouth.   pravastatin (PRAVACHOL) 40 MG tablet TAKE ONE TABLET BY MOUTH EVERY NIGHT AT BEDTIME   vitamin B-12 (CYANOCOBALAMIN) 1000 MCG tablet Take 1,000 mcg by mouth daily.   omeprazole (PRILOSEC) 20 MG capsule Take 1 capsule (20 mg total) by mouth 2 (two) times daily as needed (reflux).   No facility-administered encounter medications on file as of 01/24/2022.    Allergies (verified) Methocarbamol [methocarbamol]   History: Past Medical History:  Diagnosis Date   Arthritis    KNEES, HANDS    Chronic kidney disease    HX OF KIDNEY STONES    Diabetes mellitus ([place]    ED (erectile dysfunction)    GERD (gastroesophageal reflux disease)    History of DVT (deep vein thrombosis) 2016   Provoked by surgery; moved to pulm   Hyperlipidemia    Hypertension    Hypothyroid    Nephrolithiasis  OSA (obstructive sleep apnea)    Peripheral vascular disease (HCC)    left leg circulaton problem per pt    Rosacea    Vitamin B 12 deficiency    Past Surgical History:  Procedure Laterality Date   APPENDECTOMY     EYE SURGERY Left 07/19/2019   Cataract removal   HERNIA REPAIR     UMBILICAL    LITHOTRIPSY     X 4   OTHER SURGICAL HISTORY     TUMOR REMOVED RIGHT FACE - BENIGN    OTHER SURGICAL HISTORY     small facial tumor removal 1970s   TOTAL KNEE ARTHROPLASTY  06/24/2011   Procedure:  TOTAL KNEE ARTHROPLASTY;  Surgeon: Mauri Pole;  Location: [facility];  Service: Orthopedics;  Laterality: Right;   TOTAL KNEE ARTHROPLASTY  09/03/2011   Procedure: TOTAL KNEE ARTHROPLASTY;  Surgeon: Mauri Pole, MD;  Location: [facility];  Service: Orthopedics;  Laterality: Left;   Family History  Problem Relation Age of Onset   Alzheimer's disease Mother    CAD Mother    Heart disease Mother    Lung disease Father 23       Pulmonary Embolus   Diabetes Maternal Grandfather    Heart disease Paternal Grandfather    Thyroid disease Daughter    Social History   Socioeconomic History   Marital status: Married    Spouse name: Romie Minus   Number of children: 2   Years of education: 12   Highest education level: Not on file  Occupational History   Not on file  Tobacco Use   Smoking status: Never   Smokeless tobacco: Never  Vaping Use   Vaping Use: Never used  Substance and Sexual Activity   Alcohol use: No   Drug use: No   Sexual activity: Yes    Partners: Female  Other Topics Concern   Not on file  Social History Narrative   Marital Status: Married Administrator)    Children:  Daughters Caren Griffins, Santiago Glad)    Pets:  Dog (1)    Living Situation: Lives with spouse.   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[employer])    Education:  Programmer, systems    Tobacco Use/Exposure:  None    Alcohol Use:  Occasional   Drug Use:  None   Diet:  Regular   Exercise:  Active Job   Hobbies:  Location manager, Insurance underwriter    Social Determinants of Health   Financial Resource Strain: Low Risk  (01/18/2021)   Overall Financial Resource Strain (CARDIA)    Difficulty of Paying Living Expenses: Not hard at all  Food Insecurity: No Food Insecurity (01/18/2021)   Hunger Vital Sign    Worried About Running Out of Food in the Last Year: Never true    [place] in the Last Year: Never true  Transportation Needs: No Transportation Needs (01/18/2021)   PRAPARE - Hydrologist (Medical): No    Lack of  Transportation (Non-Medical): No  Physical Activity: Sufficiently Active (01/18/2021)   Exercise Vital Sign    Days of Exercise per Week: 7 days    Minutes of Exercise per Session: 30 min  Stress: No Stress Concern Present (01/18/2021)   [place]    Feeling of Stress : Not at all  Social Connections: Moderately Isolated (01/18/2021)   Social Connection and Isolation Panel [NHANES]    Frequency of Communication with Friends and Family:  More than three times a week    Frequency of Social Gatherings with Friends and Family: More than three times a week    Attends Religious Services: Never    Marine scientist or Organizations: No    Attends Music therapist: Never    Marital Status: Married    Tobacco Counseling Counseling given: Not Answered   Clinical Intake:  Pre-visit preparation completed: Yes  Pain : No/denies pain     BMI - recorded: 33.98 Nutritional Status: BMI > 30  Obese Diabetes: Yes CBG done?: No Did pt. bring in CBG monitor from home?: No  How often do you need to have someone help you when you read instructions, pamphlets, or other written materials from your doctor or pharmacy?: 1 - Never  Diabetic?yes Nutrition Risk Assessment:  Has the patient had any N/V/D within the last 2 months?  No  Does the patient have any non-healing wounds?  No  Has the patient had any unintentional weight loss or weight gain?  No   Diabetes:  Is the patient diabetic?  Yes  If diabetic, was a CBG obtained today?  No  Did the patient bring in their glucometer from home?  No  How often do you monitor your CBG's? N/a.   Financial Strains and Diabetes Management:  Are you having any financial strains with the device, your supplies or your medication? No .  Does the patient want to be seen by Chronic Care Management for management of their diabetes?  No  Would the patient like to be referred to a  Nutritionist or for Diabetic Management?  No   Diabetic Exams:  Diabetic Eye Exam: Completed 03/14/21 Diabetic Foot Exam: Completed 04/04/21    Interpreter Needed?: No  Information entered by :: [place] of Daily Living    01/24/2022    3:55 PM  In your present state of health, do you have any difficulty performing the following activities:  Hearing? 1  Vision? 0  Difficulty concentrating or making decisions? 0  Walking or climbing stairs? 0  Dressing or bathing? 0  Doing errands, shopping? 0  Preparing Food and eating ? N  Using the Toilet? N  In the past six months, have you accidently leaked urine? N  Do you have problems with loss of bowel control? N  Managing your Medications? N  Managing your Finances? N  Housekeeping or managing your Housekeeping? N    Patient Care Team: [name], DO as PCP - General (Family Medicine)  Indicate any recent Medical Services you may have received from other than [facility] providers in the past year (date may be approximate).     Assessment:   This is a routine wellness examination for [place].  Hearing/Vision screen No results found.  Dietary issues and exercise activities discussed: Current Exercise Habits: Home exercise routine, Type of exercise: walking;calisthenics, Time (Minutes): 60, Frequency (Times/Week): 7, Weekly Exercise (Minutes/Week): 420, Intensity: Mild, Exercise limited by: None identified   Goals Addressed             This Visit's Progress    DIET - REDUCE SUGAR INTAKE   On track    Patient Stated   On track    Drink more water       Depression Screen    01/24/2022    3:47 PM 01/18/2021    3:49 PM 09/27/2020    7:28 AM 08/24/2019    3:24 PM 02/23/2013   12:42 PM  PHQ 2/9 Scores  PHQ - 2 Score 0 0 0 0 0    Fall Risk    01/24/2022    3:47 PM 01/18/2021    3:47 PM 08/24/2019    3:23 PM 02/23/2013   12:42 PM  [place] in the past year? 0 0 0 No  Number falls in past  yr: 0 0 0   Injury with Fall? 0 0 0   Risk for fall due to : No Fall Risks     Follow up Falls evaluation completed Falls prevention discussed Education provided;Falls prevention discussed     FALL RISK PREVENTION PERTAINING TO THE HOME:  Any stairs in or around the home? No  If so, are there any without handrails?  N/a Home free of loose throw rugs in walkways, pet beds, electrical cords, etc? Yes  Adequate lighting in your home to reduce risk of falls? Yes   ASSISTIVE DEVICES UTILIZED TO PREVENT FALLS:  Life alert? No  Use of a cane, walker or w/c? No  Grab bars in the bathroom? Yes  Shower chair or bench in shower? No  Elevated toilet seat or a handicapped toilet? Yes   TIMED UP AND GO:  Was the test performed? No .    Cognitive Function:        01/24/2022    4:08 PM  6CIT Screen  What Year? 0 points  What month? 0 points  What time? 0 points  Count back from 20 0 points  Months in reverse 0 points  Repeat phrase 0 points  Total Score 0 points    Immunizations Immunization History  Administered Date(s) Administered   Fluad Quad(high Dose 65+) 02/25/2019, 03/15/2020, 04/04/2021   Influenza, High Dose Seasonal PF 02/23/2013, 03/07/2015, 02/28/2016, 02/26/2017, 03/04/2018   Influenza,inj,Quad PF,6+ Mos 02/23/2013, 03/07/2015   Influenza-Unspecified 02/23/2013, 02/28/2016   PFIZER(Purple Top)SARS-COV-2 Vaccination 07/23/2019, 08/13/2019, 02/12/2020, 01/19/2021   Pneumococcal Conjugate-13 06/01/2009, 04/22/2014, 04/22/2014   Pneumococcal Polysaccharide-23 06/01/2009, 06/01/2009   Td 02/26/2005   Tdap 02/26/2005, 12/30/2011   Zoster Recombinat (Shingrix) 08/29/2017, 12/31/2017   Zoster, Live 06/01/2009    TDAP status: Due, Education has been provided regarding the importance of this vaccine. Advised may receive this vaccine at local pharmacy or Health Dept. Aware to provide a copy of the vaccination record if obtained from local pharmacy or Health Dept.  Verbalized acceptance and understanding.  Flu Vaccine status: Up to date  Pneumococcal vaccine status: Up to date  Covid-19 vaccine status: Declined, Education has been provided regarding the importance of this vaccine but patient still declined. Advised may receive this vaccine at local pharmacy or Health Dept.or vaccine clinic. Aware to provide a copy of the vaccination record if obtained from local pharmacy or Health Dept. Verbalized acceptance and understanding.  Qualifies for Shingles Vaccine? Yes   Zostavax completed No   Shingrix Completed?: Yes  Screening Tests Health Maintenance  Topic Date Due   TETANUS/TDAP  12/29/2021   INFLUENZA VACCINE  01/15/2022   COVID-19 Vaccine (5 - Pfizer series) 04/04/2022 (Originally 03/16/2021)   OPHTHALMOLOGY EXAM  03/14/2022   FOOT EXAM  04/04/2022   HEMOGLOBIN A1C  04/04/2022   Pneumonia Vaccine 26+ Years old  Completed   Hepatitis C Screening  Completed   Zoster Vaccines- Shingrix  Completed   HPV VACCINES  Aged Out   COLONOSCOPY (Pts 45-53yr Insurance coverage will need to be confirmed)  Discontinued    Health Maintenance  Health Maintenance Due  Topic Date Due  TETANUS/TDAP  12/29/2021   INFLUENZA VACCINE  01/15/2022    Colorectal cancer screening: No longer required.   Lung Cancer Screening: (Low Dose CT Chest recommended if Age 28-80 years, 30 pack-year currently smoking OR have quit w/in 15years.) does not qualify.   Lung Cancer Screening Referral: n/a  Additional Screening:  Hepatitis C Screening: does qualify; Completed 02/23/20  Vision Screening: Recommended annual ophthalmology exams for early detection of glaucoma and other disorders of the eye. Is the patient up to date with their annual eye exam?  Yes  Who is the provider or what is the name of the office in which the patient attends annual eye exams? Dr. Samara Snide If pt is not established with a provider, would they like to be referred to a provider to establish  care? No .   Dental Screening: Recommended annual dental exams for proper oral hygiene  Community Resource Referral / Chronic Care Management: CRR required this visit?  No   CCM required this visit?  No      Plan:     I have personally reviewed and noted the following in the patient's chart:   Medical and social history Use of alcohol, tobacco or illicit drugs  Current medications and supplements including opioid prescriptions. Patient is not currently taking opioid prescriptions. Functional ability and status Nutritional status Physical activity Advanced directives List of other physicians Hospitalizations, surgeries, and ER visits in previous 12 months Vitals Screenings to include cognitive, depression, and falls Referrals and appointments  In addition, I have reviewed and discussed with patient certain preventive protocols, quality metrics, and best practice recommendations. A written personalized care plan for preventive services as well as general preventive health recommendations were provided to patient.   Due to this being a telephonic visit, the after visit summary with patients personalized plan was offered to patient via mail or my-chart. Patient would like to access on my-chart.   Duard Brady Devann [place], [place]   01/24/2022   Nurse Notes: none

## 2022-01-24 NOTE — Patient Instructions (Signed)
Brett Armstrong , Thank you for taking time to come for your Medicare Wellness Visit. I appreciate your ongoing commitment to your health goals. Please review the following plan we discussed and let me know if I can assist you in the future.   Screening recommendations/referrals: Colonoscopy: no longer needed Recommended yearly ophthalmology/optometry visit for glaucoma screening and checkup Recommended yearly dental visit for hygiene and checkup  Vaccinations: Influenza vaccine: up to date Pneumococcal vaccine: up to date Tdap vaccine: Due-May obtain vaccine at your local pharmacy.  Shingles vaccine: up to date   Covid-19: declined  Advanced directives: yes, not on file  Conditions/risks identified: see problem list   Next appointment: Follow up in one year for your annual wellness visit.   Preventive Care 26 Years and Older, Male Preventive care refers to lifestyle choices and visits with your health care provider that can promote health and wellness. What does preventive care include? A yearly physical exam. This is also called an annual well check. Dental exams once or twice a year. Routine eye exams. Ask your health care provider how often you should have your eyes checked. Personal lifestyle choices, including: Daily care of your teeth and gums. Regular physical activity. Eating a healthy diet. Avoiding tobacco and drug use. Limiting alcohol use. Practicing safe sex. Taking low doses of aspirin every day. Taking vitamin and mineral supplements as recommended by your health care provider. What happens during an annual well check? The services and screenings done by your health care provider during your annual well check will depend on your age, overall health, lifestyle risk factors, and family history of disease. Counseling  Your health care provider may ask you questions about your: Alcohol use. Tobacco use. Drug use. Emotional well-being. Home and relationship  well-being. Sexual activity. Eating habits. History of falls. Memory and ability to understand (cognition). Work and work Statistician. Screening  You may have the following tests or measurements: Height, weight, and BMI. Blood pressure. Lipid and cholesterol levels. These may be checked every 5 years, or more frequently if you are over 70 years old. Skin check. Lung cancer screening. You may have this screening every year starting at age 11 if you have a 30-pack-year history of smoking and currently smoke or have quit within the past 15 years. Fecal occult blood test (FOBT) of the stool. You may have this test every year starting at age 57. Flexible sigmoidoscopy or colonoscopy. You may have a sigmoidoscopy every 5 years or a colonoscopy every 10 years starting at age 73. Prostate cancer screening. Recommendations will vary depending on your family history and other risks. Hepatitis C blood test. Hepatitis B blood test. Sexually transmitted disease (STD) testing. Diabetes screening. This is done by checking your blood sugar (glucose) after you have not eaten for a while (fasting). You may have this done every 1-3 years. Abdominal aortic aneurysm (AAA) screening. You may need this if you are a current or former smoker. Osteoporosis. You may be screened starting at age 39 if you are at high risk. Talk with your health care provider about your test results, treatment options, and if necessary, the need for more tests. Vaccines  Your health care provider may recommend certain vaccines, such as: Influenza vaccine. This is recommended every year. Tetanus, diphtheria, and acellular pertussis (Tdap, Td) vaccine. You may need a Td booster every 10 years. Zoster vaccine. You may need this after age 29. Pneumococcal 13-valent conjugate (PCV13) vaccine. One dose is recommended after age 78. Pneumococcal polysaccharide (  PPSV23) vaccine. One dose is recommended after age 57. Talk to your health care  provider about which screenings and vaccines you need and how often you need them. This information is not intended to replace advice given to you by your health care provider. Make sure you discuss any questions you have with your health care provider. Document Released: 06/30/2015 Document Revised: 02/21/2016 Document Reviewed: 04/04/2015 Elsevier Interactive Patient Education  2017 [place] Prevention in the Home Falls can cause injuries. They can happen to people of all ages. There are many things you can do to make your home safe and to help prevent falls. What can I do on the outside of my home? Regularly fix the edges of walkways and driveways and fix any cracks. Remove anything that might make you trip as you walk through a door, such as a raised step or threshold. Trim any bushes or trees on the path to your home. Use bright outdoor lighting. Clear any walking paths of anything that might make someone trip, such as rocks or tools. Regularly check to see if handrails are loose or broken. Make sure that both sides of any steps have handrails. Any raised decks and porches should have guardrails on the edges. Have any leaves, snow, or ice cleared regularly. Use sand or salt on walking paths during winter. Clean up any spills in your garage right away. This includes oil or grease spills. What can I do in the bathroom? Use night lights. Install grab bars by the toilet and in the tub and shower. Do not use towel bars as grab bars. Use non-skid mats or decals in the tub or shower. If you need to sit down in the shower, use a plastic, non-slip stool. Keep the floor dry. Clean up any water that spills on the floor as soon as it happens. Remove soap buildup in the tub or shower regularly. Attach bath mats securely with double-sided non-slip rug tape. Do not have throw rugs and other things on the floor that can make you trip. What can I do in the bedroom? Use night lights. Make  sure that you have a light by your bed that is easy to reach. Do not use any sheets or blankets that are too big for your bed. They should not hang down onto the floor. Have a firm chair that has side arms. You can use this for support while you get dressed. Do not have throw rugs and other things on the floor that can make you trip. What can I do in the kitchen? Clean up any spills right away. Avoid walking on wet floors. Keep items that you use a lot in easy-to-reach places. If you need to reach something above you, use a strong step stool that has a grab bar. Keep electrical cords out of the way. Do not use floor polish or wax that makes floors slippery. If you must use wax, use non-skid floor wax. Do not have throw rugs and other things on the floor that can make you trip. What can I do with my stairs? Do not leave any items on the stairs. Make sure that there are handrails on both sides of the stairs and use them. Fix handrails that are broken or loose. Make sure that handrails are as long as the stairways. Check any carpeting to make sure that it is firmly attached to the stairs. Fix any carpet that is loose or worn. Avoid having throw rugs at the top or  bottom of the stairs. If you do have throw rugs, attach them to the floor with carpet tape. Make sure that you have a light switch at the top of the stairs and the bottom of the stairs. If you do not have them, ask someone to add them for you. What else can I do to help prevent falls? Wear shoes that: Do not have high heels. Have rubber bottoms. Are comfortable and fit you well. Are closed at the toe. Do not wear sandals. If you use a stepladder: Make sure that it is fully opened. Do not climb a closed stepladder. Make sure that both sides of the stepladder are locked into place. Ask someone to hold it for you, if possible. Clearly mark and make sure that you can see: Any grab bars or handrails. First and last steps. Where the  edge of each step is. Use tools that help you move around (mobility aids) if they are needed. These include: Canes. Walkers. Scooters. Crutches. Turn on the lights when you go into a dark area. Replace any light bulbs as soon as they burn out. Set up your furniture so you have a clear path. Avoid moving your furniture around. If any of your floors are uneven, fix them. If there are any pets around you, be aware of where they are. Review your medicines with your doctor. Some medicines can make you feel dizzy. This can increase your chance of falling. Ask your doctor what other things that you can do to help prevent falls. This information is not intended to replace advice given to you by your health care provider. Make sure you discuss any questions you have with your health care provider. Document Released: 03/30/2009 Document Revised: 11/09/2015 Document Reviewed: 07/08/2014 Elsevier Interactive Patient Education  2017 [employer].

## 2022-01-27 ENCOUNTER — Other Ambulatory Visit: Payer: Self-pay | Admitting: Family Medicine

## 2022-01-27 DIAGNOSIS — K219 Gastro-esophageal reflux disease without esophagitis: Secondary | ICD-10-CM

## 2022-01-30 DIAGNOSIS — G4733 Obstructive sleep apnea (adult) (pediatric): Secondary | ICD-10-CM | POA: Diagnosis not present

## 2022-02-25 ENCOUNTER — Other Ambulatory Visit: Payer: Self-pay | Admitting: Family Medicine

## 2022-02-25 MED ORDER — LOSARTAN POTASSIUM-HCTZ 100-25 MG PO TABS: 1.0000 | ORAL_TABLET | Freq: Every morning | ORAL | 1 refills | Status: DC

## 2022-03-08 ENCOUNTER — Telehealth: Payer: Self-pay | Admitting: Family Medicine

## 2022-03-08 DIAGNOSIS — I10 Essential (primary) hypertension: Secondary | ICD-10-CM

## 2022-03-08 NOTE — Telephone Encounter (Signed)
Medication: carvedilol (COREG) 6.25 MG tablet   Has the patient contacted their pharmacy? Yes.    Preferred Pharmacy (with phone number or street name):  [id number] - [address], [place] [address]  Phone:  [phone]  Fax:  [phone]

## 2022-03-11 MED ORDER — CARVEDILOL 6.25 MG PO TABS: 6.2500 mg | ORAL_TABLET | Freq: Two times a day (BID) | ORAL | 2 refills | Status: DC

## 2022-03-20 ENCOUNTER — Encounter: Payer: Self-pay | Admitting: Family Medicine

## 2022-03-20 ENCOUNTER — Ambulatory Visit (INDEPENDENT_AMBULATORY_CARE_PROVIDER_SITE_OTHER): Payer: PPO | Admitting: Family Medicine

## 2022-03-20 VITALS — BP 118/80 | HR 64 | Temp 97.9°F | Ht 68.0 in | Wt 223.0 lb

## 2022-03-20 DIAGNOSIS — Z23 Encounter for immunization: Secondary | ICD-10-CM | POA: Diagnosis not present

## 2022-03-20 DIAGNOSIS — I10 Essential (primary) hypertension: Secondary | ICD-10-CM

## 2022-03-20 DIAGNOSIS — E1169 Type 2 diabetes mellitus with other specified complication: Secondary | ICD-10-CM | POA: Diagnosis not present

## 2022-03-20 DIAGNOSIS — E669 Obesity, unspecified: Secondary | ICD-10-CM | POA: Diagnosis not present

## 2022-03-20 DIAGNOSIS — E7800 Pure hypercholesterolemia, unspecified: Secondary | ICD-10-CM | POA: Diagnosis not present

## 2022-03-20 LAB — COMPREHENSIVE METABOLIC PANEL
ALT: 24 U/L (ref 0–53)
AST: 24 U/L (ref 0–37)
Albumin: 4.4 g/dL (ref 3.5–5.2)
Alkaline Phosphatase: 55 U/L (ref 39–117)
BUN: 13 mg/dL (ref 6–23)
CO2: 34 mEq/L — ABNORMAL HIGH (ref 19–32)
Calcium: 9.6 mg/dL (ref 8.4–10.5)
Chloride: 100 mEq/L (ref 96–112)
Creatinine, Ser: 1.1 mg/dL (ref 0.40–1.50)
GFR: 64.08 mL/min (ref >60.00)
Glucose, Bld: 100 mg/dL — ABNORMAL HIGH (ref 70–99)
Potassium: 4.2 mEq/L (ref 3.5–5.1)
Sodium: 140 mEq/L (ref 135–145)
Total Bilirubin: 0.8 mg/dL (ref 0.2–1.2)
Total Protein: 7.1 g/dL (ref 6.0–8.3)

## 2022-03-20 LAB — LIPID PANEL
Cholesterol: 142 mg/dL (ref 0–200)
HDL: 41.1 mg/dL (ref >39.00)
LDL Cholesterol: 68 mg/dL (ref 0–99)
NonHDL: 101.19
Total CHOL/HDL Ratio: 3
Triglycerides: 165 mg/dL — ABNORMAL HIGH (ref 0.0–149.0)
VLDL: 33 mg/dL (ref 0.0–40.0)

## 2022-03-20 LAB — HEMOGLOBIN A1C: Hgb A1c MFr Bld: 6.6 % — ABNORMAL HIGH (ref 4.6–6.5)

## 2022-03-20 NOTE — Addendum Note (Signed)
Addended by: Sharon Seller B on: 03/20/2022 08:57 AM   Modules accepted: Orders

## 2022-03-20 NOTE — Patient Instructions (Signed)
Give us 2-3 business days to get the results of your labs back.   Keep the diet clean and stay active.  Please schedule your eye exam.   Let us know if you need anything.  

## 2022-03-20 NOTE — Progress Notes (Signed)
Subjective:  No chief complaint on file.   Brett Armstrong is a 81 y.o. male here for follow-up of diabetes.   Brett Armstrong does not monitor his sugars.  Patient does not require insulin.   Medications include: Metformin XR 1000 mg/d Diet is healthy overall.  Exercise: walking, lift wts  Hypertension Patient presents for hypertension follow up. He does not monitor home blood pressures. He is compliant with medications- Coreg 6.25 mg bid, Hyzaar 100-25 mg/d. Patient has these side effects of medication: none Diet/exercise as above. No CP or SOB.   Hyperlipidemia Patient presents for dyslipidemia follow up. Currently being treated with pravastatin 40 mg/d and compliance with treatment thus far has been good. He denies myalgias. Diet/exercise as above.  The patient is not known to have coexisting coronary artery disease.  Past Medical History:  Diagnosis Date   Arthritis    KNEES, HANDS    Chronic kidney disease    HX OF KIDNEY STONES    Diabetes mellitus ([place])    ED (erectile dysfunction)    GERD (gastroesophageal reflux disease)    History of DVT (deep vein thrombosis) 2016   Provoked by surgery; moved to pulm   Hyperlipidemia    Hypertension    Hypothyroid    Nephrolithiasis    OSA (obstructive sleep apnea)    Peripheral vascular disease (HCC)    left leg circulaton problem per pt    Rosacea    Vitamin B 12 deficiency      Related testing: Retinal exam: Due Pneumovax: done  Objective:  BP 118/80 (BP Location: Left Arm, Patient Position: Sitting, Cuff Size: Normal)   Pulse 64   Temp 97.9 F (36.6 C) (Oral)   Ht '5\' 8"'$  (1.727 m)   Wt 223 lb (101.2 kg)   SpO2 97%   BMI 33.91 kg/m  General:  Well developed, well nourished, in no apparent distress Lungs:  CTAB, no access msc use Cardio:  RRR, no bruits, no LE edema Psych: Age appropriate judgment and insight  Assessment:   Diabetes mellitus type 2 in obese ([place]) - Plan: Lipid panel, Comprehensive metabolic  panel, Hemoglobin A1c  Essential hypertension, benign   Plan:   Chronic, stable. Cont Metformin XR 1000 mg/d. Counseled on diet and exercise. Encouraged to schedule eye exam.  Chronic, stable. Cont Coreg 6.25 mg bid, Hyzaar 100-25 mg/d.  Chronic, stable. Cont pravastatin 40 mg/d.  Tdap and flu today.  F/u in 6 mo. The patient voiced understanding and agreement to the plan.  [place], DO 03/20/22 8:47 AM

## 2022-04-05 ENCOUNTER — Encounter: Payer: Self-pay | Admitting: Family Medicine

## 2022-04-05 ENCOUNTER — Ambulatory Visit (INDEPENDENT_AMBULATORY_CARE_PROVIDER_SITE_OTHER): Payer: PPO | Admitting: Family Medicine

## 2022-04-05 VITALS — BP 142/70 | HR 62 | Temp 98.2°F | Ht 68.0 in | Wt 221.2 lb

## 2022-04-05 DIAGNOSIS — I10 Essential (primary) hypertension: Secondary | ICD-10-CM

## 2022-04-05 NOTE — Patient Instructions (Signed)
Check your blood pressures 2-3 times per week, alternating the time of day you check it. If it is high, considering waiting 1-2 minutes and rechecking. If it gets higher, your anxiety is likely creeping up and we should avoid rechecking.   Send me a message in 2 weeks with your blood pressure readings.   Keep the diet clean and stay active.  Let us know if you need anything.

## 2022-04-05 NOTE — Progress Notes (Signed)
Chief Complaint  Patient presents with   blood pressure problem    Subjective Brett Armstrong is a 81 y.o. male who presents for hypertension follow up. He does monitor home blood pressures. Blood pressures ranging from 80-180's/50-90's on average. He is compliant with medications- Hyzaar 100-25 mg/d, Coreg 6.25 mg bid. Patient has these side effects of medication: none He is usually adhering to a healthy diet overall. Current exercise: walking No Cp or SOB.    Past Medical History:  Diagnosis Date   Arthritis    KNEES, HANDS    Chronic kidney disease    HX OF KIDNEY STONES    Diabetes mellitus ([place])    ED (erectile dysfunction)    GERD (gastroesophageal reflux disease)    History of DVT (deep vein thrombosis) 2016   Provoked by surgery; moved to pulm   Hyperlipidemia    Hypertension    Hypothyroid    Nephrolithiasis    OSA (obstructive sleep apnea)    Peripheral vascular disease (HCC)    left leg circulaton problem per pt    Rosacea    Vitamin B 12 deficiency     Exam BP (!) 142/70 (BP Location: Left Arm, Cuff Size: Normal)   Pulse 62   Temp 98.2 F (36.8 C) (Oral)   Ht '5\' 8"'$  (1.727 m)   Wt 221 lb 4 oz (100.4 kg)   SpO2 93%   BMI 33.64 kg/m  General:  well developed, well nourished, in no apparent distress Heart: RRR, no bruits, no LE edema Lungs: clear to auscultation, no accessory muscle use Psych: well oriented with normal range of affect and appropriate judgment/insight  Essential hypertension, benign  Chronic, unstable. He is coming off finishing a cake. Will see if his BP improves with simply avoiding sweets. Cont Hyzaar 100-25 mg/d and Coreg 6.25 mg bid. Counseled on diet and exercise. Monitor BP at home, send readings in 2 weeks.  F/u as originally scheduled. The patient voiced understanding and agreement to the plan.  [place], DO 04/05/22  11:37 AM

## 2022-04-12 ENCOUNTER — Other Ambulatory Visit: Payer: Self-pay | Admitting: Family Medicine

## 2022-04-12 DIAGNOSIS — E785 Hyperlipidemia, unspecified: Secondary | ICD-10-CM

## 2022-04-25 ENCOUNTER — Telehealth: Payer: Self-pay | Admitting: Family Medicine

## 2022-04-25 NOTE — Telephone Encounter (Signed)
Pt came in to drop off bp reading from the last two weeks. Placed in pcp's folder.

## 2022-04-26 ENCOUNTER — Other Ambulatory Visit: Payer: Self-pay | Admitting: Family Medicine

## 2022-04-26 DIAGNOSIS — R7301 Impaired fasting glucose: Secondary | ICD-10-CM

## 2022-04-29 MED ORDER — CARVEDILOL 12.5 MG PO TABS: 12.5000 mg | ORAL_TABLET | Freq: Two times a day (BID) | ORAL | 3 refills | Status: DC

## 2022-04-29 NOTE — Telephone Encounter (Signed)
Home readings running 140-160's/70-80's. Increased dosage of Coreg from 6.25 mg bid to 12.5 mg bid. Please inform pt and sched in 2 weeks to reck. Ty.

## 2022-04-30 NOTE — Telephone Encounter (Signed)
Called left message to call back 

## 2022-04-30 NOTE — Telephone Encounter (Signed)
Called and informed / scheduled appt.

## 2022-05-15 ENCOUNTER — Ambulatory Visit (INDEPENDENT_AMBULATORY_CARE_PROVIDER_SITE_OTHER): Payer: PPO | Admitting: Family Medicine

## 2022-05-15 ENCOUNTER — Encounter: Payer: Self-pay | Admitting: Family Medicine

## 2022-05-15 VITALS — BP 128/84 | HR 64 | Temp 98.2°F | Ht 68.0 in | Wt 224.1 lb

## 2022-05-15 DIAGNOSIS — I10 Essential (primary) hypertension: Secondary | ICD-10-CM

## 2022-05-15 NOTE — Progress Notes (Signed)
Chief Complaint  Patient presents with   Follow-up    Subjective Brett Armstrong is a 81 y.o. male who presents for hypertension follow up. He does not monitor home blood pressures. He is compliant with medications- Hyzaar 100-25 mg/d, Coreg 12.5 mg bid. Patient has these side effects of medication: none He is adhering to a healthy diet overall. Current exercise: walking, some wt resistance No Cp or SOB.    Past Medical History:  Diagnosis Date   Arthritis    KNEES, HANDS    Chronic kidney disease    HX OF KIDNEY STONES    Diabetes mellitus ([place])    ED (erectile dysfunction)    GERD (gastroesophageal reflux disease)    History of DVT (deep vein thrombosis) 2016   Provoked by surgery; moved to pulm   Hyperlipidemia    Hypertension    Hypothyroid    Nephrolithiasis    OSA (obstructive sleep apnea)    Peripheral vascular disease (HCC)    left leg circulaton problem per pt    Rosacea    Vitamin B 12 deficiency     Exam BP 128/84 (BP Location: Right Arm, Patient Position: Sitting, Cuff Size: Normal)   Pulse 64   Temp 98.2 F (36.8 C) (Oral)   Ht '5\' 8"'$  (1.727 m)   Wt 224 lb 2 oz (101.7 kg)   SpO2 94%   BMI 34.08 kg/m  General:  well developed, well nourished, in no apparent distress Heart: RRR, no bruits, no LE edema, DP pulses 2+ b/l Lungs: clear to auscultation, no accessory muscle use Skin: No ext skin lesions noted on feet Neuro: Sensation intact to pinprick b/l feet Psych: well oriented with normal range of affect and appropriate judgment/insight  Essential hypertension, benign  Chronic, stable. Cont Hyzaar 100-25 mg/d, Coreg 12.5 mg bid. Counseled on diet and exercise. Needs eye exam, he will schedule.  F/u in 5 mo for CPE. The patient voiced understanding and agreement to the plan.  [place], DO 05/15/22  2:39 PM

## 2022-05-15 NOTE — Patient Instructions (Addendum)
Keep the diet clean and stay active.  Because your blood pressure is well-controlled, you no longer have to check your blood pressure at home anymore unless you wish. Some people check it twice daily every day and some people stop altogether. Either or anything in between is fine. Strong work!  Please schedule your eye exam.   Let us know if you need anything.

## 2022-05-24 ENCOUNTER — Other Ambulatory Visit: Payer: Self-pay | Admitting: Family Medicine

## 2022-06-28 ENCOUNTER — Encounter: Payer: Self-pay | Admitting: Family Medicine

## 2022-06-28 ENCOUNTER — Ambulatory Visit (INDEPENDENT_AMBULATORY_CARE_PROVIDER_SITE_OTHER): Payer: PPO | Admitting: Family Medicine

## 2022-06-28 VITALS — BP 138/76 | HR 75 | Temp 97.8°F | Resp 16 | Ht 68.0 in | Wt 223.0 lb

## 2022-06-28 DIAGNOSIS — R350 Frequency of micturition: Secondary | ICD-10-CM | POA: Diagnosis not present

## 2022-06-28 DIAGNOSIS — M546 Pain in thoracic spine: Secondary | ICD-10-CM

## 2022-06-28 LAB — POC URINALSYSI DIPSTICK (AUTOMATED)
Bilirubin, UA: NEGATIVE
Glucose, UA: NEGATIVE
Ketones, UA: NEGATIVE
Leukocytes, UA: NEGATIVE — AB
Nitrite, UA: NEGATIVE
Protein, UA: NEGATIVE
Spec Grav, UA: 1.01 (ref 1.010–1.025)
Urobilinogen, UA: 0.2 E.U./dL
pH, UA: 5 (ref 5.0–8.0)

## 2022-06-28 NOTE — Addendum Note (Signed)
Addended by: Manuela Schwartz on: 06/28/2022 04:50 PM   Modules accepted: Orders

## 2022-06-28 NOTE — Progress Notes (Signed)
Chief Complaint  Patient presents with   Flank Pain    Here for flank pain    Subjective: Patient is a 82 y.o. male here for flank pain.  Starting 4 weeks ago. L flank pain radiating to the hip. No bleeding, N/V, fevers. Not getting better. Hx of kidney stones, some aspects are similar. It comes and goes. No inj. He was lifting some heavy boxes prior to this starting.  Started some stretches/exercises for the low back that did help a little. No skin changes. He tends to stay well hydrated.    Past Medical History:  Diagnosis Date   Arthritis    KNEES, HANDS    Chronic kidney disease    HX OF KIDNEY STONES    Diabetes mellitus ([place])    ED (erectile dysfunction)    GERD (gastroesophageal reflux disease)    History of DVT (deep vein thrombosis) 2016   Provoked by surgery; moved to pulm   Hyperlipidemia    Hypertension    Hypothyroid    Nephrolithiasis    OSA (obstructive sleep apnea)    Peripheral vascular disease (HCC)    left leg circulaton problem per pt    Rosacea    Vitamin B 12 deficiency     Objective: BP 138/76 (BP Location: Right Arm, Patient Position: Sitting, Cuff Size: Normal)   Pulse 75   Temp 97.8 F (36.6 C) (Oral)   Resp 16   Ht '5\' 8"'$  (1.727 m)   Wt 223 lb (101.2 kg)   SpO2 98%   BMI 33.91 kg/m  General: Awake, appears stated age Heart: RRR, no LE edema Lungs: CTAB, no rales, wheezes or rhonchi. No accessory muscle use Abd: BS+, S, NT, ND MSK: No midline or paraspinal muscular tenderness bilaterally, negative Lloyd's test bilaterally, no asymmetry, poor hamstring range of motion bilaterally Neuro: Gait is normal for age, no cerebellar signs, negative straight leg bilaterally Psych: Age appropriate judgment and insight, normal affect and mood  Assessment and Plan: Acute left-sided thoracic back pain - Plan: POCT Urinalysis Dipstick (Automated)  Will ck UA for blood, will add on microscopy for RBC's.  Despite his history of kidney stones, this does  not seem like a kidney stone currently.  He has been doing lumbar stretches and exercises at home with some relief. If his urine did not show any blood, I would favor offering physical therapy.  Heat, Tylenol, activity as tolerated in the meanwhile. The patient voiced understanding and agreement to the plan.  [place], DO 06/28/22  4:40 PM

## 2022-06-28 NOTE — Patient Instructions (Signed)
Heat (pad or rice pillow in microwave) over affected area, 10-15 minutes twice daily.   Ice/cold pack over area for 10-15 min twice daily.  OK to take Tylenol 1000 mg (2 extra strength tabs) or 975 mg (3 regular strength tabs) every 6 hours as needed.  Let us know if you need anything.

## 2022-06-29 LAB — URINE CULTURE
MICRO NUMBER:: 14424799
Result:: NO GROWTH
SPECIMEN QUALITY:: ADEQUATE

## 2022-06-29 LAB — MICROALBUMIN / CREATININE URINE RATIO
Creatinine, Urine: 75 mg/dL (ref 20–320)
Microalb Creat Ratio: 7 mcg/mg creat (ref <30)
Microalb, Ur: 0.5 mg/dL

## 2022-07-11 DIAGNOSIS — L820 Inflamed seborrheic keratosis: Secondary | ICD-10-CM | POA: Diagnosis not present

## 2022-07-11 DIAGNOSIS — L814 Other melanin hyperpigmentation: Secondary | ICD-10-CM | POA: Diagnosis not present

## 2022-07-11 DIAGNOSIS — L578 Other skin changes due to chronic exposure to nonionizing radiation: Secondary | ICD-10-CM | POA: Diagnosis not present

## 2022-07-11 DIAGNOSIS — D1801 Hemangioma of skin and subcutaneous tissue: Secondary | ICD-10-CM | POA: Diagnosis not present

## 2022-07-11 DIAGNOSIS — L570 Actinic keratosis: Secondary | ICD-10-CM | POA: Diagnosis not present

## 2022-07-11 DIAGNOSIS — L821 Other seborrheic keratosis: Secondary | ICD-10-CM | POA: Diagnosis not present

## 2022-08-28 DIAGNOSIS — Z96651 Presence of right artificial knee joint: Secondary | ICD-10-CM | POA: Diagnosis not present

## 2022-08-28 DIAGNOSIS — Z96652 Presence of left artificial knee joint: Secondary | ICD-10-CM | POA: Diagnosis not present

## 2022-08-28 DIAGNOSIS — Z96653 Presence of artificial knee joint, bilateral: Secondary | ICD-10-CM | POA: Diagnosis not present

## 2022-09-01 ENCOUNTER — Other Ambulatory Visit: Payer: Self-pay | Admitting: Family Medicine

## 2022-09-01 DIAGNOSIS — R7301 Impaired fasting glucose: Secondary | ICD-10-CM

## 2022-10-31 ENCOUNTER — Other Ambulatory Visit: Payer: Self-pay | Admitting: Family Medicine

## 2022-10-31 DIAGNOSIS — R7301 Impaired fasting glucose: Secondary | ICD-10-CM

## 2022-11-13 ENCOUNTER — Encounter: Payer: Self-pay | Admitting: Family Medicine

## 2022-11-13 ENCOUNTER — Ambulatory Visit (INDEPENDENT_AMBULATORY_CARE_PROVIDER_SITE_OTHER): Payer: PPO | Admitting: Family Medicine

## 2022-11-13 VITALS — BP 132/68 | HR 60 | Temp 98.1°F | Ht 68.0 in | Wt 223.0 lb

## 2022-11-13 DIAGNOSIS — E669 Obesity, unspecified: Secondary | ICD-10-CM

## 2022-11-13 DIAGNOSIS — E785 Hyperlipidemia, unspecified: Secondary | ICD-10-CM | POA: Diagnosis not present

## 2022-11-13 DIAGNOSIS — Z6833 Body mass index (BMI) 33.0-33.9, adult: Secondary | ICD-10-CM | POA: Diagnosis not present

## 2022-11-13 DIAGNOSIS — Z7984 Long term (current) use of oral hypoglycemic drugs: Secondary | ICD-10-CM

## 2022-11-13 DIAGNOSIS — E1169 Type 2 diabetes mellitus with other specified complication: Secondary | ICD-10-CM | POA: Diagnosis not present

## 2022-11-13 DIAGNOSIS — M545 Low back pain, unspecified: Secondary | ICD-10-CM

## 2022-11-13 DIAGNOSIS — Z0000 Encounter for general adult medical examination without abnormal findings: Secondary | ICD-10-CM | POA: Diagnosis not present

## 2022-11-13 NOTE — Patient Instructions (Addendum)
Give [place] 2-3 business days to get the results of your labs back.   Keep the diet clean and stay active.  Heat (pad or rice pillow in microwave) over affected area, 10-15 minutes twice daily.   Ice/cold pack over area for 10-15 min twice daily.  OK to take Tylenol 1000 mg (2 extra strength tabs) or 975 mg (3 regular strength tabs) every 6 hours as needed.  Send me a message in 3-4 weeks if no better.   Let us know if you need anything.  EXERCISES  RANGE OF MOTION (ROM) AND STRETCHING EXERCISES - Low Back Pain Most people with lower back pain will find that their symptoms get worse with excessive bending forward (flexion) or arching at the lower back (extension). The exercises that will help resolve your symptoms will focus on the opposite motion.  If you have pain, numbness or tingling which travels down into your buttocks, leg or foot, the goal of the therapy is for these symptoms to move closer to your back and eventually resolve. Sometimes, these leg symptoms will get better, but your lower back pain may worsen. This is often an indication of progress in your rehabilitation. Be very alert to any changes in your symptoms and the activities in which you participated in the 24 hours prior to the change. Sharing this information with your caregiver will allow him or her to most efficiently treat your condition. These exercises may help you when beginning to rehabilitate your injury. Your symptoms may resolve with or without further involvement from your physician, physical therapist or athletic trainer. While completing these exercises, remember:  Restoring tissue flexibility helps normal motion to return to the joints. This allows healthier, less painful movement and activity. An effective stretch should be held for at least 30 seconds. A stretch should never be painful. You should only feel a gentle lengthening or release in the stretched tissue. FLEXION RANGE OF MOTION AND STRETCHING  EXERCISES:  STRETCH - Flexion, Single Knee to Chest  Lie on a firm bed or floor with both legs extended in front of you. Keeping one leg in contact with the floor, bring your opposite knee to your chest. Hold your leg in place by either grabbing behind your thigh or at your knee. Pull until you feel a gentle stretch in your low back. Hold 30 seconds. Slowly release your grasp and repeat the exercise with the opposite side. Repeat 2 times. Complete this exercise 3 times per week.   STRETCH - Flexion, Double Knee to Chest Lie on a firm bed or floor with both legs extended in front of you. Keeping one leg in contact with the floor, bring your opposite knee to your chest. Tense your stomach muscles to support your back and then lift your other knee to your chest. Hold your legs in place by either grabbing behind your thighs or at your knees. Pull both knees toward your chest until you feel a gentle stretch in your low back. Hold 30 seconds. Tense your stomach muscles and slowly return one leg at a time to the floor. Repeat 2 times. Complete this exercise 3 times per week.   STRETCH - Low Trunk Rotation Lie on a firm bed or floor. Keeping your legs in front of you, bend your knees so they are both pointed toward the ceiling and your feet are flat on the floor. Extend your arms out to the side. This will stabilize your upper body by keeping your shoulders in contact with  the floor. Gently and slowly drop both knees together to one side until you feel a gentle stretch in your low back. Hold for 30 seconds. Tense your stomach muscles to support your lower back as you bring your knees back to the starting position. Repeat the exercise to the other side. Repeat 2 times. Complete this exercise at least 3 times per week.   EXTENSION RANGE OF MOTION AND FLEXIBILITY EXERCISES:  STRETCH - Extension, Prone on Elbows  Lie on your stomach on the floor, a bed will be too soft. Place your palms about shoulder  width apart and at the height of your head. Place your elbows under your shoulders. If this is too painful, stack pillows under your chest. Allow your body to relax so that your hips drop lower and make contact more completely with the floor. Hold this position for 30 seconds. Slowly return to lying flat on the floor. Repeat 2 times. Complete this exercise 3 times per week.   RANGE OF MOTION - Extension, Prone Press Ups Lie on your stomach on the floor, a bed will be too soft. Place your palms about shoulder width apart and at the height of your head. Keeping your back as relaxed as possible, slowly straighten your elbows while keeping your hips on the floor. You may adjust the placement of your hands to maximize your comfort. As you gain motion, your hands will come more underneath your shoulders. Hold this position 30 seconds. Slowly return to lying flat on the floor. Repeat 2 times. Complete this exercise 3 times per week.   RANGE OF MOTION- Quadruped, Neutral Spine  Assume a hands and knees position on a firm surface. Keep your hands under your shoulders and your knees under your hips. You may place padding under your knees for comfort. Drop your head and point your tailbone toward the ground below you. This will round out your lower back like an angry cat. Hold this position for 30 seconds. Slowly lift your head and release your tail bone so that your back sags into a large arch, like an old horse. Hold this position for 30 seconds. Repeat this until you feel limber in your low back. Now, find your "sweet spot." This will be the most comfortable position somewhere between the two previous positions. This is your neutral spine. Once you have found this position, tense your stomach muscles to support your low back. Hold this position for 30 seconds. Repeat 2 times. Complete this exercise 3 times per week.   STRENGTHENING EXERCISES - Low Back Sprain These exercises may help you when  beginning to rehabilitate your injury. These exercises should be done near your "sweet spot." This is the neutral, low-back arch, somewhere between fully rounded and fully arched, that is your least painful position. When performed in this safe range of motion, these exercises can be used for people who have either a flexion or extension based injury. These exercises may resolve your symptoms with or without further involvement from your physician, physical therapist or athletic trainer. While completing these exercises, remember:  Muscles can gain both the endurance and the strength needed for everyday activities through controlled exercises. Complete these exercises as instructed by your physician, physical therapist or athletic trainer. Increase the resistance and repetitions only as guided. You may experience muscle soreness or fatigue, but the pain or discomfort you are trying to eliminate should never worsen during these exercises. If this pain does worsen, stop and make certain you are following  the directions exactly. If the pain is still present after adjustments, discontinue the exercise until you can discuss the trouble with your caregiver.  STRENGTHENING - Deep Abdominals, Pelvic Tilt  Lie on a firm bed or floor. Keeping your legs in front of you, bend your knees so they are both pointed toward the ceiling and your feet are flat on the floor. Tense your lower abdominal muscles to press your low back into the floor. This motion will rotate your pelvis so that your tail bone is scooping upwards rather than pointing at your feet or into the floor. With a gentle tension and even breathing, hold this position for 3 seconds. Repeat 2 times. Complete this exercise 3 times per week.   STRENGTHENING - Abdominals, Crunches  Lie on a firm bed or floor. Keeping your legs in front of you, bend your knees so they are both pointed toward the ceiling and your feet are flat on the floor. Cross your arms over  your chest. Slightly tip your chin down without bending your neck. Tense your abdominals and slowly lift your trunk high enough to just clear your shoulder blades. Lifting higher can put excessive stress on the lower back and does not further strengthen your abdominal muscles. Control your return to the starting position. Repeat 2 times. Complete this exercise 3 times per week.   STRENGTHENING - Quadruped, Opposite UE/LE Lift  Assume a hands and knees position on a firm surface. Keep your hands under your shoulders and your knees under your hips. You may place padding under your knees for comfort. Find your neutral spine and gently tense your abdominal muscles so that you can maintain this position. Your shoulders and hips should form a rectangle that is parallel with the floor and is not twisted. Keeping your trunk steady, lift your right hand no higher than your shoulder and then your left leg no higher than your hip. Make sure you are not holding your breath. Hold this position for 30 seconds. Continuing to keep your abdominal muscles tense and your back steady, slowly return to your starting position. Repeat with the opposite arm and leg. Repeat 2 times. Complete this exercise 3 times per week.   STRENGTHENING - Abdominals and Quadriceps, Straight Leg Raise  Lie on a firm bed or floor with both legs extended in front of you. Keeping one leg in contact with the floor, bend the other knee so that your foot can rest flat on the floor. Find your neutral spine, and tense your abdominal muscles to maintain your spinal position throughout the exercise. Slowly lift your straight leg off the floor about 6 inches for a count of 3, making sure to not hold your breath. Still keeping your neutral spine, slowly lower your leg all the way to the floor. Repeat this exercise with each leg 2 times. Complete this exercise 3 times per week.  POSTURE AND BODY MECHANICS CONSIDERATIONS - Low Back Sprain Keeping  correct posture when sitting, standing or completing your activities will reduce the stress put on different body tissues, allowing injured tissues a chance to heal and limiting painful experiences. The following are general guidelines for improved posture.  While reading these guidelines, remember: The exercises prescribed by your provider will help you have the flexibility and strength to maintain correct postures. The correct posture provides the best environment for your joints to work. All of your joints have less wear and tear when properly supported by a spine with good posture. This means  you will experience a healthier, less painful body. Correct posture must be practiced with all of your activities, especially prolonged sitting and standing. Correct posture is as important when doing repetitive low-stress activities (typing) as it is when doing a single heavy-load activity (lifting).  RESTING POSITIONS Consider which positions are most painful for you when choosing a resting position. If you have pain with flexion-based activities (sitting, bending, stooping, squatting), choose a position that allows you to rest in a less flexed posture. You would want to avoid curling into a fetal position on your side. If your pain worsens with extension-based activities (prolonged standing, working overhead), avoid resting in an extended position such as sleeping on your stomach. Most people will find more comfort when they rest with their spine in a more neutral position, neither too rounded nor too arched. Lying on a non-sagging bed on your side with a pillow between your knees, or on your back with a pillow under your knees will often provide some relief. Keep in mind, being in any one position for a prolonged period of time, no matter how correct your posture, can still lead to stiffness.  PROPER SITTING POSTURE In order to minimize stress and discomfort on your spine, you must sit with correct posture.  Sitting with good posture should be effortless for a healthy body. Returning to good posture is a gradual process. Many people can work toward this most comfortably by using various supports until they have the flexibility and strength to maintain this posture on their own. When sitting with proper posture, your ears will fall over your shoulders and your shoulders will fall over your hips. You should use the back of the chair to support your upper back. Your lower back will be in a neutral position, just slightly arched. You may place a small pillow or folded towel at the base of your lower back for  support.  When working at a desk, create an environment that supports good, upright posture. Without extra support, muscles tire, which leads to excessive strain on joints and other tissues. Keep these recommendations in mind:  CHAIR: A chair should be able to slide under your desk when your back makes contact with the back of the chair. This allows you to work closely. The chair's height should allow your eyes to be level with the upper part of your monitor and your hands to be slightly lower than your elbows.  BODY POSITION Your feet should make contact with the floor. If this is not possible, use a foot rest. Keep your ears over your shoulders. This will reduce stress on your neck and low back.  INCORRECT SITTING POSTURES  If you are feeling tired and unable to assume a healthy sitting posture, do not slouch or slump. This puts excessive strain on your back tissues, causing more damage and pain. Healthier options include: Using more support, like a lumbar pillow. Switching tasks to something that requires you to be upright or walking. Talking a brief walk. Lying down to rest in a neutral-spine position.  PROLONGED STANDING WHILE SLIGHTLY LEANING FORWARD  When completing a task that requires you to lean forward while standing in one place for a long time, place either foot up on a stationary 2-4  inch high object to help maintain the best posture. When both feet are on the ground, the lower back tends to lose its slight inward curve. If this curve flattens (or becomes too large), then the back and your other joints  will experience too much stress, tire more quickly, and can cause pain.  CORRECT STANDING POSTURES Proper standing posture should be assumed with all daily activities, even if they only take a few moments, like when brushing your teeth. As in sitting, your ears should fall over your shoulders and your shoulders should fall over your hips. You should keep a slight tension in your abdominal muscles to brace your spine. Your tailbone should point down to the ground, not behind your body, resulting in an over-extended swayback posture.   INCORRECT STANDING POSTURES  Common incorrect standing postures include a forward head, locked knees and/or an excessive swayback. WALKING Walk with an upright posture. Your ears, shoulders and hips should all line-up.  PROLONGED ACTIVITY IN A FLEXED POSITION When completing a task that requires you to bend forward at your waist or lean over a low surface, try to find a way to stabilize 3 out of 4 of your limbs. You can place a hand or elbow on your thigh or rest a knee on the surface you are reaching across. This will provide you more stability, so that your muscles do not tire as quickly. By keeping your knees relaxed, or slightly bent, you will also reduce stress across your lower back. CORRECT LIFTING TECHNIQUES  DO : Assume a wide stance. This will provide you more stability and the opportunity to get as close as possible to the object which you are lifting. Tense your abdominals to brace your spine. Bend at the knees and hips. Keeping your back locked in a neutral-spine position, lift using your leg muscles. Lift with your legs, keeping your back straight. Test the weight of unknown objects before attempting to lift them. Try to keep your  elbows locked down at your sides in order get the best strength from your shoulders when carrying an object.   Always ask for help when lifting heavy or awkward objects. INCORRECT LIFTING TECHNIQUES DO NOT:  Lock your knees when lifting, even if it is a small object. Bend and twist. Pivot at your feet or move your feet when needing to change directions. Assume that you can safely pick up even a paperclip without proper posture.

## 2022-11-13 NOTE — Progress Notes (Signed)
Chief Complaint  Patient presents with   Annual Exam    Back pain     Well Male Brett Armstrong is here for a complete physical.   His last physical was >1 year ago.  Current diet: in general, a "healthy" diet.   Current exercise: active at work Weight trend: stable Fatigue out of ordinary? No. Seat belt? Yes.   Advanced directive? Yes  Health maintenance Shingrix- Yes Tetanus- Yes Hep C- Yes Pneumonia vaccine- Yes  Chronic low back pain No recent inj or change in activity.  Seem to have gotten worse over the past month.  Bilateral but worse on the left.  Radiates into the left hip.  No bruising, redness, swelling, or bowel/bladder changes.  No neurologic signs or symptoms.  Has not tried anything at home thus far.  Past Medical History:  Diagnosis Date   Arthritis    KNEES, HANDS    Chronic kidney disease    HX OF KIDNEY STONES    Diabetes mellitus (HCC)    ED (erectile dysfunction)    GERD (gastroesophageal reflux disease)    History of DVT (deep vein thrombosis) 2016   Provoked by surgery; moved to pulm   Hyperlipidemia    Hypertension    Hypothyroid    Nephrolithiasis    OSA (obstructive sleep apnea)    Peripheral vascular disease (HCC)    left leg circulaton problem per pt    Rosacea    Vitamin B 12 deficiency      Past Surgical History:  Procedure Laterality Date   APPENDECTOMY     EYE SURGERY Left 07/19/2019   Cataract removal   HERNIA REPAIR     UMBILICAL    LITHOTRIPSY     X 4   OTHER SURGICAL HISTORY     TUMOR REMOVED RIGHT FACE - BENIGN    OTHER SURGICAL HISTORY     small facial tumor removal 1970s   TOTAL KNEE ARTHROPLASTY  06/24/2011   Procedure: TOTAL KNEE ARTHROPLASTY;  Surgeon: [name];  Location: [facility];  Service: Orthopedics;  Laterality: Right;   TOTAL KNEE ARTHROPLASTY  09/03/2011   Procedure: TOTAL KNEE ARTHROPLASTY;  Surgeon: [name], MD;  Location: [facility];  Service: Orthopedics;  Laterality: Left;    Medications   Current Outpatient Medications on File Prior to Visit  Medication Sig Dispense Refill   aspirin EC 325 MG tablet Take 325 mg by mouth daily.     carvedilol (COREG) 12.5 MG tablet TAKE 1 TABLET BY MOUTH TWICE A DAY WITH A MEAL 60 tablet 3   Cholecalciferol (VITAMIN D) 50 MCG (2000 UT) CAPS Take 1 capsule by mouth daily.     levothyroxine (SYNTHROID) 88 MCG tablet TAKE ONE TABLET BY MOUTH DAILY BEFORE BREAKFAST 90 tablet 2   losartan-hydrochlorothiazide (HYZAAR) 100-25 MG tablet Take 1 tablet by mouth every morning. 90 tablet 1   metFORMIN (GLUCOPHAGE-XR) 500 MG 24 hr tablet TAKE TWO TABLETS BY MOUTH EVERY EVENING 138 tablet 0   Omega-3 Fatty Acids (FISH OIL) 1200 MG CAPS Take by mouth.     omeprazole (PRILOSEC) 20 MG capsule TAKE ONE CAPSULE BY MOUTH TWICE A DAY AS NEEDED FOR REFLUX 180 capsule 2   pravastatin (PRAVACHOL) 40 MG tablet TAKE ONE TABLET BY MOUTH EVERY NIGHT AT BEDTIME 90 tablet 2   vitamin B-12 (CYANOCOBALAMIN) 1000 MCG tablet Take 1,000 mcg by mouth daily.     Allergies Allergies  Allergen Reactions   Methocarbamol [Methocarbamol] Nausea And Vomiting  Family History Family History  Problem Relation Age of Onset   Alzheimer's disease Mother    CAD Mother    Heart disease Mother    Lung disease Father 42       Pulmonary Embolus   Diabetes Maternal Grandfather    Heart disease Paternal Grandfather    Thyroid disease Daughter     Review of Systems: Constitutional:  no fevers Eye:  no recent significant change in vision Ears:  No changes in hearing Nose/Mouth/Throat:  no complaints of nasal congestion, no sore throat Cardiovascular: no chest pain Respiratory:  No shortness of breath Gastrointestinal:  No change in bowel habits GU:  No frequency Integumentary:  no abnormal skin lesions reported Neurologic:  no headaches Endocrine:  denies unexplained weight changes  Exam BP 132/68 (BP Location: Left Arm, Cuff Size: Large)   Pulse 60   Temp 98.1 F (36.7 C)  (Oral)   Ht 5\' 8"  (1.727 m)   Wt 223 lb (101.2 kg)   SpO2 96%   BMI 33.91 kg/m  General:  well developed, well nourished, in no apparent distress Skin:  no significant moles, warts, or growths on exposed skin or feet Head:  no masses, lesions, or tenderness Eyes:  pupils equal and round, sclera anicteric without injection Ears:  canals without lesions, TMs shiny without retraction, no obvious effusion, no erythema Nose:  nares patent, mucosa normal Throat/Pharynx:  lips and gingiva without lesion; tongue and uvula midline; non-inflamed pharynx; no exudates or postnasal drainage Lungs:  clear to auscultation, breath sounds equal bilaterally, no respiratory distress Cardio:  regular rate and rhythm, no LE edema or bruits Rectal: Deferred GI: BS+, S, NT, ND, no masses or organomegaly Musculoskeletal: No TTP over the lumbar paraspinal musculature.  Poor hamstring range of motion bilaterally.  Symmetrical muscle groups noted without atrophy or deformity Neuro:  gait normal; negative straight leg bilaterally.  Sensation intact to pinprick over the feet.  Deep tendon reflexes normal and symmetric Psych: well oriented with normal range of affect and appropriate judgment/insight  Assessment and Plan  Well adult exam  Type 2 diabetes mellitus with obesity (HCC) - Plan: Comprehensive metabolic panel, Lipid panel, Hemoglobin A1c  Left-sided low back pain without sciatica, unspecified chronicity   Well 82 y.o. male. Counseled on diet and exercise. Low back pain: Stretches and exercises, heat, ice, Tylenol.  Send message in 3 to 4 weeks if no improvement and we will refer to physical therapy. Other orders as above. Follow up in 6 months for a diabetes visit.  The patient voiced understanding and agreement to the plan.  [name] Doyline, DO 11/13/22 4:56 PM

## 2022-11-14 LAB — COMPREHENSIVE METABOLIC PANEL
ALT: 26 U/L (ref 0–53)
AST: 23 U/L (ref 0–37)
Albumin: 4.5 g/dL (ref 3.5–5.2)
Alkaline Phosphatase: 56 U/L (ref 39–117)
BUN: 16 mg/dL (ref 6–23)
CO2: 34 mEq/L — ABNORMAL HIGH (ref 19–32)
Calcium: 9.6 mg/dL (ref 8.4–10.5)
Chloride: 98 mEq/L (ref 96–112)
Creatinine, Ser: 1.15 mg/dL (ref 0.40–1.50)
GFR: 60.47 mL/min (ref >60.00)
Glucose, Bld: 94 mg/dL (ref 70–99)
Potassium: 4 mEq/L (ref 3.5–5.1)
Sodium: 140 mEq/L (ref 135–145)
Total Bilirubin: 0.9 mg/dL (ref 0.2–1.2)
Total Protein: 7.3 g/dL (ref 6.0–8.3)

## 2022-11-14 LAB — LIPID PANEL
Cholesterol: 126 mg/dL (ref 0–200)
HDL: 38.5 mg/dL — ABNORMAL LOW (ref >39.00)
LDL Cholesterol: 53 mg/dL (ref 0–99)
NonHDL: 87.14
Total CHOL/HDL Ratio: 3
Triglycerides: 170 mg/dL — ABNORMAL HIGH (ref 0.0–149.0)
VLDL: 34 mg/dL (ref 0.0–40.0)

## 2022-11-14 LAB — HEMOGLOBIN A1C: Hgb A1c MFr Bld: 6.4 % (ref 4.6–6.5)

## 2022-11-14 NOTE — Addendum Note (Signed)
Addended by: Wilford Corner on: 11/14/2022 03:58 PM   Modules accepted: Orders

## 2022-11-15 ENCOUNTER — Other Ambulatory Visit: Payer: Self-pay | Admitting: Family Medicine

## 2022-11-15 DIAGNOSIS — G4733 Obstructive sleep apnea (adult) (pediatric): Secondary | ICD-10-CM

## 2022-12-09 ENCOUNTER — Ambulatory Visit (HOSPITAL_BASED_OUTPATIENT_CLINIC_OR_DEPARTMENT_OTHER): Payer: PPO | Admitting: Pulmonary Disease

## 2022-12-09 ENCOUNTER — Encounter (HOSPITAL_BASED_OUTPATIENT_CLINIC_OR_DEPARTMENT_OTHER): Payer: Self-pay | Admitting: Pulmonary Disease

## 2022-12-09 VITALS — BP 120/60 | HR 65 | Temp 97.6°F | Ht 68.0 in | Wt 221.0 lb

## 2022-12-09 DIAGNOSIS — R0609 Other forms of dyspnea: Secondary | ICD-10-CM

## 2022-12-09 DIAGNOSIS — G4733 Obstructive sleep apnea (adult) (pediatric): Secondary | ICD-10-CM

## 2022-12-09 NOTE — Assessment & Plan Note (Signed)
Currently stable or improved.  If this worsens, consider cardiac evaluation. HRCT was negative for ILD

## 2022-12-09 NOTE — Patient Instructions (Signed)
X Rx for replacement autoCPAP 5-12 cm

## 2022-12-09 NOTE — Assessment & Plan Note (Signed)
He had moderate OSA.  He has been on the machine for 10 years.  He is very compliant and has significantly benefited from the machine.  He denies daytime somnolence and fatigue which is all improved after CPAP use He would be eligible for replacement machine and we will provide him with auto CPAP 5 to 12 cm and set him up with a DME. We will review download and adjust settings as needed  Weight loss encouraged, compliance with goal of at least 4-6 hrs every night is the expectation. Advised against medications with sedative side effects Cautioned against driving when sleepy - understanding that sleepiness will vary on a day to day basis

## 2022-12-09 NOTE — Progress Notes (Signed)
   Subjective:    Patient ID: Brett Armstrong, male    DOB: July 06, 1940, 82 y.o.   MRN: [id number]  HPI  82 yo never smoker  for FU of OSA.   OSA was diagnosed by an overnight PSG in 2013, he was placed on auto CPAP with nasal mask and follow-up download in 2015 which I reviewed shows good control of events on average pressure 8 to 9 cm.  He had a Philips machine but was unable to obtain a replacement during the recall Chest x-ray showed mild prominent interstitial markings. Since he had dyspnea on exertion and I heard some crackles on exam, we obtained HRCT chest which showed right lower lobe scarring and rounded atelectasis.  2-year follow-up visit today. He reports good benefits from his CPAP machine.  He wakes up feeling rested.  He used to have restless sleep and this is resolved.  He uses nasal pillows.  Denies any problems with mask on pressure. He would like a replacement CPAP machine. 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  Dyspnea is stable, not worse.   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 Also denies presyncope, palpitations, heartburn, abdominal pain, nausea, vomiting, diarrhea or change in bowel or urinary habits, dysuria,hematuria, rash, arthralgias, visual complaints, headache, numbness weakness or ataxia.     Objective:   Physical Exam  Gen. Pleasant, obese, in no distress ENT - no lesions, no post nasal drip Neck: No JVD, no thyromegaly, no carotid bruits Lungs: no use of accessory muscles, no dullness to percussion,bibasal dry crackles Cardiovascular: Rhythm regular, heart  sounds  normal, no murmurs or gallops, no peripheral edema Musculoskeletal: No deformities, no cyanosis or clubbing , no tremors       Assessment & Plan:

## 2022-12-22 ENCOUNTER — Other Ambulatory Visit: Payer: Self-pay | Admitting: Family Medicine

## 2022-12-22 DIAGNOSIS — E785 Hyperlipidemia, unspecified: Secondary | ICD-10-CM

## 2022-12-23 ENCOUNTER — Other Ambulatory Visit: Payer: Self-pay | Admitting: Family Medicine

## 2022-12-23 MED ORDER — LOSARTAN POTASSIUM-HCTZ 100-25 MG PO TABS: 1.0000 | ORAL_TABLET | Freq: Every morning | ORAL | 1 refills | Status: DC

## 2022-12-25 DIAGNOSIS — E119 Type 2 diabetes mellitus without complications: Secondary | ICD-10-CM | POA: Diagnosis not present

## 2022-12-25 DIAGNOSIS — H5213 Myopia, bilateral: Secondary | ICD-10-CM | POA: Diagnosis not present

## 2022-12-25 LAB — HM DIABETES EYE EXAM

## 2022-12-27 ENCOUNTER — Other Ambulatory Visit (INDEPENDENT_AMBULATORY_CARE_PROVIDER_SITE_OTHER): Payer: PPO

## 2022-12-27 DIAGNOSIS — E785 Hyperlipidemia, unspecified: Secondary | ICD-10-CM | POA: Diagnosis not present

## 2022-12-27 LAB — LIPID PANEL
Cholesterol: 148 mg/dL (ref 0–200)
HDL: 40.5 mg/dL (ref >39.00)
LDL Cholesterol: 78 mg/dL (ref 0–99)
NonHDL: 107.52
Total CHOL/HDL Ratio: 4
Triglycerides: 149 mg/dL (ref 0.0–149.0)
VLDL: 29.8 mg/dL (ref 0.0–40.0)

## 2023-01-01 ENCOUNTER — Telehealth (HOSPITAL_BASED_OUTPATIENT_CLINIC_OR_DEPARTMENT_OTHER): Payer: Self-pay | Admitting: Pulmonary Disease

## 2023-01-01 NOTE — Telephone Encounter (Signed)
Patient states he was told to call our office when he received notifications about receiving new CPAP supplies. Patient states he has and has received approval from insurance in the mail as well.

## 2023-01-08 DIAGNOSIS — G4733 Obstructive sleep apnea (adult) (pediatric): Secondary | ICD-10-CM | POA: Diagnosis not present

## 2023-01-21 ENCOUNTER — Other Ambulatory Visit: Payer: Self-pay | Admitting: Family Medicine

## 2023-02-05 ENCOUNTER — Ambulatory Visit (INDEPENDENT_AMBULATORY_CARE_PROVIDER_SITE_OTHER): Payer: PPO | Admitting: *Deleted

## 2023-02-05 DIAGNOSIS — Z0000 Encounter for general adult medical examination without abnormal findings: Secondary | ICD-10-CM

## 2023-02-05 NOTE — Progress Notes (Signed)
Subjective:   Brett Armstrong is a 82 y.o. male who presents for Medicare Annual/Subsequent preventive examination.  Visit Complete: Virtual  I connected with  Wilhemena Durie on 02/05/23 by a audio enabled telemedicine application and verified that I am speaking with the correct person using two identifiers.  Patient Location: Home  Provider Location: Office/Clinic  I discussed the limitations of evaluation and management by telemedicine. The patient expressed understanding and agreed to proceed.   Review of Systems     Cardiac Risk Factors include: advanced age (>73men, >58 women);male gender;diabetes mellitus;dyslipidemia;hypertension;obesity (BMI >30kg/m2)     Objective:    Vital Signs: Unable to obtain new vitals due to this being a telehealth visit.      02/05/2023    3:37 PM 01/24/2022    3:46 PM 01/18/2021    3:45 PM 08/24/2019    3:18 PM 09/03/2011    6:22 PM 08/27/2011    9:48 AM 06/24/2011    3:21 PM  Advanced Directives  Does Patient Have a Medical Advance Directive? Yes Yes Yes Yes Patient has advance directive, copy not in chart Patient has advance directive, copy in chart Patient has advance directive, copy not in chart  Type of Advance Directive Healthcare Power of [place];Living will Healthcare Power of [place];Living will Healthcare Power of [place];Living will Healthcare Power of [place];Living will  Healthcare Power of [place];Living will Healthcare Power of Attorney  Does patient want to make changes to medical advance directive? No - Patient declined No - Patient declined  Yes (MAU/Ambulatory/Procedural Areas - Information given)     Copy of Healthcare Power of Attorney in Chart? Yes - validated most recent copy scanned in chart (See row information) No - copy requested Yes - validated most recent copy scanned in chart (See row information) Yes - validated most recent copy scanned in chart (See row information)     Pre-existing out of facility DNR order  (yellow form or pink MOST form)     No No No    Current Medications (verified) Outpatient Encounter Medications as of 02/05/2023  Medication Sig   aspirin EC 325 MG tablet Take 325 mg by mouth daily.   carvedilol (COREG) 12.5 MG tablet TAKE 1 TABLET BY MOUTH TWICE A DAY WITH A MEAL   Cholecalciferol (VITAMIN D) 50 MCG (2000 UT) CAPS Take 1 capsule by mouth daily.   levothyroxine (SYNTHROID) 88 MCG tablet TAKE 1 TABLET BY MOUTH DAILY BEFORE BREAKFAST   losartan-hydrochlorothiazide (HYZAAR) 100-25 MG tablet Take 1 tablet by mouth every morning.   metFORMIN (GLUCOPHAGE-XR) 500 MG 24 hr tablet TAKE TWO TABLETS BY MOUTH EVERY EVENING   Omega-3 Fatty Acids (FISH OIL) 1200 MG CAPS Take by mouth.   omeprazole (PRILOSEC) 20 MG capsule TAKE ONE CAPSULE BY MOUTH TWICE A DAY AS NEEDED FOR REFLUX   pravastatin (PRAVACHOL) 40 MG tablet TAKE ONE TABLET BY MOUTH EVERY NIGHT AT BEDTIME   vitamin B-12 (CYANOCOBALAMIN) 1000 MCG tablet Take 1,000 mcg by mouth daily.   No facility-administered encounter medications on file as of 02/05/2023.    Allergies (verified) Methocarbamol [methocarbamol]   History: Past Medical History:  Diagnosis Date   Arthritis    KNEES, HANDS    Chronic kidney disease    HX OF KIDNEY STONES    Diabetes mellitus (HCC)    ED (erectile dysfunction)    GERD (gastroesophageal reflux disease)    History of DVT (deep vein thrombosis) 2016   Provoked by surgery; moved to pulm   Hyperlipidemia  Hypertension    Hypothyroid    Nephrolithiasis    OSA (obstructive sleep apnea)    Peripheral vascular disease (HCC)    left leg circulaton problem per pt    Rosacea    Vitamin B 12 deficiency    Past Surgical History:  Procedure Laterality Date   APPENDECTOMY     EYE SURGERY Left 07/19/2019   Cataract removal   HERNIA REPAIR     UMBILICAL    LITHOTRIPSY     X 4   OTHER SURGICAL HISTORY     TUMOR REMOVED RIGHT FACE - BENIGN    OTHER SURGICAL HISTORY     small facial tumor  removal 1970s   TOTAL KNEE ARTHROPLASTY  06/24/2011   Procedure: TOTAL KNEE ARTHROPLASTY;  Surgeon: [name];  Location: [facility];  Service: Orthopedics;  Laterality: Right;   TOTAL KNEE ARTHROPLASTY  09/03/2011   Procedure: TOTAL KNEE ARTHROPLASTY;  Surgeon: [name], MD;  Location: [facility];  Service: Orthopedics;  Laterality: Left;   Family History  Problem Relation Age of Onset   Alzheimer's disease Mother    CAD Mother    Heart disease Mother    Lung disease Father 74       Pulmonary Embolus   Diabetes Maternal Grandfather    Heart disease Paternal Grandfather    Thyroid disease Daughter    Social History   Socioeconomic History   Marital status: Married    Spouse name: [name]   Number of children: 2   Years of education: 12   Highest education level: Not on file  Occupational History   Not on file  Tobacco Use   Smoking status: Never   Smokeless tobacco: Never  Vaping Use   Vaping status: Never Used  Substance and Sexual Activity   Alcohol use: No   Drug use: No   Sexual activity: Yes    Partners: Female  Other Topics Concern   Not on file  Social History Narrative   Marital Status: Married Airline pilot)    Children:  Daughters [name], [name])    Pets:  Dog (1)    Living Situation: Lives with spouse.   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[employer])    Education:  Engineer, agricultural    Tobacco Use/Exposure:  None    Alcohol Use:  Occasional   Drug Use:  None   Diet:  Regular   Exercise:  Active Job   Hobbies:  Architect, Therapist, music    Social Determinants of Health   Financial Resource Strain: Low Risk  (02/05/2023)   Overall Financial Resource Strain (CARDIA)    Difficulty of Paying Living Expenses: Not hard at all  Food Insecurity: No Food Insecurity (02/05/2023)   Hunger Vital Sign    Worried About Running Out of Food in the Last Year: Never true    Ran Out of Food in the Last Year: Never true  Transportation Needs: No Transportation Needs (02/05/2023)    PRAPARE - Administrator, Civil Service (Medical): No    Lack of Transportation (Non-Medical): No  Physical Activity: Sufficiently Active (02/05/2023)   Exercise Vital Sign    Days of Exercise per Week: 4 days    Minutes of Exercise per Session: 90 min  Stress: No Stress Concern Present (02/05/2023)   [employer] of Occupational Health - Occupational Stress Questionnaire    Feeling of Stress : Not at all  Social Connections: Socially Isolated (02/05/2023)   Social Connection and Isolation  Panel [NHANES]    Frequency of Communication with Friends and Family: Once a week    Frequency of Social Gatherings with Friends and Family: Never    Attends Religious Services: Never    Database administrator or Organizations: No    Attends Engineer, structural: Never    Marital Status: Married    Tobacco Counseling Counseling given: Not Answered   Clinical Intake:  Pre-visit preparation completed: Yes  Pain : No/denies pain  Nutritional Risks: None Diabetes: Yes CBG done?: No Did pt. bring in CBG monitor from home?: No  How often do you need to have someone help you when you read instructions, pamphlets, or other written materials from your doctor or pharmacy?: 1 - Never  Interpreter Needed?: No  Information entered by :: Donne Anon, CMA   Activities of Daily Living    02/05/2023    3:42 PM  In your present state of health, do you have any difficulty performing the following activities:  Hearing? 1  Vision? 1  Difficulty concentrating or making decisions? 1  Comment remembering names  Walking or climbing stairs? 0  Dressing or bathing? 0  Doing errands, shopping? 0  Preparing Food and eating ? N  Using the Toilet? N  In the past six months, have you accidently leaked urine? Y  Do you have problems with loss of bowel control? N  Comment has happened once or twice  Managing your Medications? N  Managing your Finances? N  Housekeeping or managing  your Housekeeping? N    Patient Care Team: Sharlene Dory, DO as PCP - General (Family Medicine)  Indicate any recent Medical Services you may have received from other than [facility] providers in the past year (date may be approximate).     Assessment:   This is a routine wellness examination for Grady.  Hearing/Vision screen No results found.  Dietary issues and exercise activities discussed:     Goals Addressed   None    Depression Screen    02/05/2023    3:58 PM 11/13/2022    2:37 PM 06/28/2022    4:17 PM 01/24/2022    3:47 PM 01/18/2021    3:49 PM 09/27/2020    7:28 AM 08/24/2019    3:24 PM  PHQ 2/9 Scores  PHQ - 2 Score 0 0 0 0 0 0 0  PHQ- 9 Score  0         Fall Risk    02/05/2023    3:43 PM 11/13/2022    2:37 PM 06/28/2022    4:16 PM 01/24/2022    3:47 PM 01/18/2021    3:47 PM  Fall Risk   Falls in the past year? 0 0 0 0 0  Number falls in past yr: 0 0 0 0 0  Injury with Fall? 0 0 0 0 0  Risk for fall due to : No Fall Risks No Fall Risks  No Fall Risks   Follow up Falls evaluation completed Falls evaluation completed Falls evaluation completed Falls evaluation completed Falls prevention discussed    MEDICARE RISK AT HOME: Medicare Risk at Home Any stairs in or around the home?: Yes If so, are there any without handrails?: No Home free of loose throw rugs in walkways, pet beds, electrical cords, etc?: Yes Adequate lighting in your home to reduce risk of falls?: Yes Life alert?: No Use of a cane, walker or w/c?: No Grab bars in the bathroom?: No Shower chair or bench in  shower?: No Elevated toilet seat or a handicapped toilet?: Yes  TIMED UP AND GO:  Was the test performed?  No    Cognitive Function:        02/05/2023    4:00 PM 01/24/2022    4:08 PM  6CIT Screen  What Year? 0 points 0 points  What month? 0 points 0 points  What time? 0 points 0 points  Count back from 20 0 points 0 points  Months in reverse 0 points 0 points  Repeat phrase 0  points 0 points  Total Score 0 points 0 points    Immunizations Immunization History  Administered Date(s) Administered   Fluad Quad(high Dose 65+) 02/25/2019, 03/15/2020, 04/04/2021, 03/20/2022   Influenza, High Dose Seasonal PF 02/23/2013, 03/07/2015, 02/28/2016, 02/26/2017, 03/04/2018   Influenza,inj,Quad PF,6+ Mos 02/23/2013, 03/07/2015   Influenza-Unspecified 02/23/2013, 03/07/2015, 02/28/2016   PFIZER(Purple Top)SARS-COV-2 Vaccination 07/23/2019, 08/13/2019, 02/12/2020, 01/19/2021   Pneumococcal Conjugate-13 06/01/2009, 04/22/2014, 04/22/2014   Pneumococcal Polysaccharide-23 06/01/2009, 06/01/2009   Td 02/26/2005   Td (Adult),5 Lf Tetanus Toxid, Preservative Free 02/26/2005   Tdap 02/26/2005, 12/30/2011, 03/20/2022   Zoster Recombinant(Shingrix) 08/29/2017, 12/31/2017   Zoster, Live 06/01/2009    TDAP status: Up to date  Flu Vaccine status: Due, Education has been provided regarding the importance of this vaccine. Advised may receive this vaccine at local pharmacy or Health Dept. Aware to provide a copy of the vaccination record if obtained from local pharmacy or Health Dept. Verbalized acceptance and understanding.  Pneumococcal vaccine status: Up to date  Covid-19 vaccine status: Information provided on how to obtain vaccines.   Qualifies for Shingles Vaccine? Yes   Zostavax completed Yes   Shingrix Completed?: Yes  Screening Tests Health Maintenance  Topic Date Due   COVID-19 Vaccine (5 - 2023-24 season) 02/15/2022   Medicare Annual Wellness (AWV)  01/25/2023   INFLUENZA VACCINE  01/16/2023   FOOT EXAM  05/16/2023   HEMOGLOBIN A1C  05/16/2023   Diabetic kidney evaluation - Urine ACR  06/29/2023   Diabetic kidney evaluation - eGFR measurement  11/13/2023   OPHTHALMOLOGY EXAM  12/25/2023   DTaP/Tdap/Td (6 - Td or Tdap) 03/20/2032   Pneumonia Vaccine 6+ Years old  Completed   Hepatitis C Screening  Completed   Zoster Vaccines- Shingrix  Completed   HPV VACCINES   Aged Out   Colonoscopy  Discontinued    Health Maintenance  Health Maintenance Due  Topic Date Due   COVID-19 Vaccine (5 - 2023-24 season) 02/15/2022   Medicare Annual Wellness (AWV)  01/25/2023   INFLUENZA VACCINE  01/16/2023    Colorectal cancer screening: No longer required.   Lung Cancer Screening: (Low Dose CT Chest recommended if Age 64-80 years, 20 pack-year currently smoking OR have quit w/in 15years.) does not qualify.   Additional Screening:  Hepatitis C Screening: does qualify; Completed 02/23/20  Vision Screening: Recommended annual ophthalmology exams for early detection of glaucoma and other disorders of the eye. Is the patient up to date with their annual eye exam?  Yes  Who is the provider or what is the name of the office in which the patient attends annual eye exams? [facility]. If pt is not established with a provider, would they like to be referred to a provider to establish care? No .   Dental Screening: Recommended annual dental exams for proper oral hygiene  Diabetic Foot Exam: Diabetic Foot Exam: Completed 05/15/22  Community Resource Referral / Chronic Care Management: CRR required this visit?  No  CCM required this visit?  No     Plan:     I have personally reviewed and noted the following in the patient's chart:   Medical and social history Use of alcohol, tobacco or illicit drugs  Current medications and supplements including opioid prescriptions. Patient is not currently taking opioid prescriptions. Functional ability and status Nutritional status Physical activity Advanced directives List of other physicians Hospitalizations, surgeries, and ER visits in previous 12 months Vitals Screenings to include cognitive, depression, and falls Referrals and appointments  In addition, I have reviewed and discussed with patient certain preventive protocols, quality metrics, and best practice recommendations. A written personalized care plan  for preventive services as well as general preventive health recommendations were provided to patient.     Donne Anon, CMA   02/05/2023   After Visit Summary: (MyChart) Due to this being a telephonic visit, the after visit summary with patients personalized plan was offered to patient via MyChart   Nurse Notes: None

## 2023-02-05 NOTE — Patient Instructions (Signed)
Brett Armstrong , Thank you for taking time to come for your Medicare Wellness Visit. I appreciate your ongoing commitment to your health goals. Please review the following plan we discussed and let me know if I can assist you in the future.      This is a list of the screening recommended for you and due dates:  Health Maintenance  Topic Date Due   COVID-19 Vaccine (5 - 2023-24 season) 02/15/2022   Flu Shot  01/16/2023   Complete foot exam   05/16/2023   Hemoglobin A1C  05/16/2023   Yearly kidney health urinalysis for diabetes  06/29/2023   Yearly kidney function blood test for diabetes  11/13/2023   Eye exam for diabetics  12/25/2023   Medicare Annual Wellness Visit  [birth year]   DTaP/Tdap/Td vaccine (6 - Td or Tdap) 03/20/2032   Pneumonia Vaccine  Completed   Hepatitis C Screening  Completed   Zoster (Shingles) Vaccine  Completed   HPV Vaccine  Aged Out   Colon Cancer Screening  Discontinued    Next appointment: Follow up in one year for your annual wellness visit.   Preventive Care 80 Years and Older, Male Preventive care refers to lifestyle choices and visits with your health care provider that can promote health and wellness. What does preventive care include? A yearly physical exam. This is also called an annual well check. Dental exams once or twice a year. Routine eye exams. Ask your health care provider how often you should have your eyes checked. Personal lifestyle choices, including: Daily care of your teeth and gums. Regular physical activity. Eating a healthy diet. Avoiding tobacco and drug use. Limiting alcohol use. Practicing safe sex. Taking low doses of aspirin every day. Taking vitamin and mineral supplements as recommended by your health care provider. What happens during an annual well check? The services and screenings done by your health care provider during your annual well check will depend on your age, overall health, lifestyle risk factors, and  family history of disease. Counseling  Your health care provider may ask you questions about your: Alcohol use. Tobacco use. Drug use. Emotional well-being. Home and relationship well-being. Sexual activity. Eating habits. History of falls. Memory and ability to understand (cognition). Work and work Astronomer. Screening  You may have the following tests or measurements: Height, weight, and BMI. Blood pressure. Lipid and cholesterol levels. These may be checked every 5 years, or more frequently if you are over 80 years old. Skin check. Lung cancer screening. You may have this screening every year starting at age 80 if you have a 30-pack-year history of smoking and currently smoke or have quit within the past 15 years. Fecal occult blood test (FOBT) of the stool. You may have this test every year starting at age 80. Flexible sigmoidoscopy or colonoscopy. You may have a sigmoidoscopy every 5 years or a colonoscopy every 10 years starting at age 80. Prostate cancer screening. Recommendations will vary depending on your family history and other risks. Hepatitis C blood test. Hepatitis B blood test. Sexually transmitted disease (STD) testing. Diabetes screening. This is done by checking your blood sugar (glucose) after you have not eaten for a while (fasting). You may have this done every 1-3 years. Abdominal aortic aneurysm (AAA) screening. You may need this if you are a current or former smoker. Osteoporosis. You may be screened starting at age 80 if you are at high risk. Talk with your health care provider about your test results, treatment options,  and if necessary, the need for more tests. Vaccines  Your health care provider may recommend certain vaccines, such as: Influenza vaccine. This is recommended every year. Tetanus, diphtheria, and acellular pertussis (Tdap, Td) vaccine. You may need a Td booster every 10 years. Zoster vaccine. You may need this after age 80. Pneumococcal  13-valent conjugate (PCV13) vaccine. One dose is recommended after age 80. Pneumococcal polysaccharide (PPSV23) vaccine. One dose is recommended after age 80. Talk to your health care provider about which screenings and vaccines you need and how often you need them. This information is not intended to replace advice given to you by your health care provider. Make sure you discuss any questions you have with your health care provider. Document Released: 06/30/2015 Document Revised: 02/21/2016 Document Reviewed: 04/04/2015 Elsevier Interactive Patient Education  2017 [employer].  Fall Prevention in the Home Falls can cause injuries. They can happen to people of all ages. There are many things you can do to make your home safe and to help prevent falls. What can I do on the outside of my home? Regularly fix the edges of walkways and driveways and fix any cracks. Remove anything that might make you trip as you walk through a door, such as a raised step or threshold. Trim any bushes or trees on the path to your home. Use bright outdoor lighting. Clear any walking paths of anything that might make someone trip, such as rocks or tools. Regularly check to see if handrails are loose or broken. Make sure that both sides of any steps have handrails. Any raised decks and porches should have guardrails on the edges. Have any leaves, snow, or ice cleared regularly. Use sand or salt on walking paths during winter. Clean up any spills in your garage right away. This includes oil or grease spills. What can I do in the bathroom? Use night lights. Install grab bars by the toilet and in the tub and shower. Do not use towel bars as grab bars. Use non-skid mats or decals in the tub or shower. If you need to sit down in the shower, use a plastic, non-slip stool. Keep the floor dry. Clean up any water that spills on the floor as soon as it happens. Remove soap buildup in the tub or shower regularly. Attach  bath mats securely with double-sided non-slip rug tape. Do not have throw rugs and other things on the floor that can make you trip. What can I do in the bedroom? Use night lights. Make sure that you have a light by your bed that is easy to reach. Do not use any sheets or blankets that are too big for your bed. They should not hang down onto the floor. Have a firm chair that has side arms. You can use this for support while you get dressed. Do not have throw rugs and other things on the floor that can make you trip. What can I do in the kitchen? Clean up any spills right away. Avoid walking on wet floors. Keep items that you use a lot in easy-to-reach places. If you need to reach something above you, use a strong step stool that has a grab bar. Keep electrical cords out of the way. Do not use floor polish or wax that makes floors slippery. If you must use wax, use non-skid floor wax. Do not have throw rugs and other things on the floor that can make you trip. What can I do with my stairs? Do not leave  any items on the stairs. Make sure that there are handrails on both sides of the stairs and use them. Fix handrails that are broken or loose. Make sure that handrails are as long as the stairways. Check any carpeting to make sure that it is firmly attached to the stairs. Fix any carpet that is loose or worn. Avoid having throw rugs at the top or bottom of the stairs. If you do have throw rugs, attach them to the floor with carpet tape. Make sure that you have a light switch at the top of the stairs and the bottom of the stairs. If you do not have them, ask someone to add them for you. What else can I do to help prevent falls? Wear shoes that: Do not have high heels. Have rubber bottoms. Are comfortable and fit you well. Are closed at the toe. Do not wear sandals. If you use a stepladder: Make sure that it is fully opened. Do not climb a closed stepladder. Make sure that both sides of the  stepladder are locked into place. Ask someone to hold it for you, if possible. Clearly mark and make sure that you can see: Any grab bars or handrails. First and last steps. Where the edge of each step is. Use tools that help you move around (mobility aids) if they are needed. These include: Canes. Walkers. Scooters. Crutches. Turn on the lights when you go into a dark area. Replace any light bulbs as soon as they burn out. Set up your furniture so you have a clear path. Avoid moving your furniture around. If any of your floors are uneven, fix them. If there are any pets around you, be aware of where they are. Review your medicines with your doctor. Some medicines can make you feel dizzy. This can increase your chance of falling. Ask your doctor what other things that you can do to help prevent falls. This information is not intended to replace advice given to you by your health care provider. Make sure you discuss any questions you have with your health care provider. Document Released: 03/30/2009 Document Revised: 11/09/2015 Document Reviewed: 07/08/2014 Elsevier Interactive Patient Education  2017 [employer].

## 2023-02-08 DIAGNOSIS — G4733 Obstructive sleep apnea (adult) (pediatric): Secondary | ICD-10-CM | POA: Diagnosis not present

## 2023-02-19 ENCOUNTER — Ambulatory Visit (INDEPENDENT_AMBULATORY_CARE_PROVIDER_SITE_OTHER): Payer: PPO | Admitting: Family Medicine

## 2023-02-19 ENCOUNTER — Encounter: Payer: Self-pay | Admitting: Family Medicine

## 2023-02-19 VITALS — BP 136/80 | HR 65 | Temp 98.5°F | Ht 68.0 in | Wt 221.0 lb

## 2023-02-19 DIAGNOSIS — H6121 Impacted cerumen, right ear: Secondary | ICD-10-CM

## 2023-02-19 NOTE — Progress Notes (Signed)
Chief Complaint  Patient presents with   Ear Pain    Pt is here for bilateral ear pain. Duration: 3 weeks Progression: unchanged on R, L has improved Associated symptoms: decreased hearing, poor perception due to hearing Denies: sore throat, congestion, post nasal drip, and sneezing, bleeding, or discharge from ear Treatment to date: none  Past Medical History:  Diagnosis Date   Arthritis    KNEES, HANDS    Chronic kidney disease    HX OF KIDNEY STONES    Diabetes mellitus (HCC)    ED (erectile dysfunction)    GERD (gastroesophageal reflux disease)    History of DVT (deep vein thrombosis) 2016   Provoked by surgery; moved to pulm   Hyperlipidemia    Hypertension    Hypothyroid    Nephrolithiasis    OSA (obstructive sleep apnea)    Peripheral vascular disease (HCC)    left leg circulaton problem per pt    Rosacea    Vitamin B 12 deficiency     BP 136/80 (BP Location: Left Arm, Cuff Size: Normal)   Pulse 65   Temp 98.5 F (36.9 C) (Oral)   Ht 5\' 8"  (1.727 m)   Wt 221 lb (100.2 kg)   SpO2 97%   BMI 33.60 kg/m  General: Awake, alert, appearing stated age HEENT:  L ear- Canal patent without drainage or erythema, TM is neg R ear- canal 100% obstructed w cerumen Lungs: Normal effort, no accessory muscle use Psych: Age appropriate judgment and insight, normal mood and affect  Impacted cerumen of right ear  Irrigation today.  Avoid Q-tips.  Likely has a component of eustachian tube dysfunction.  Recommended continued use of Flonase. F/u prn.  Pt voiced understanding and agreement to the plan.  [name] [place], DO 02/19/23 1:23 PM

## 2023-02-19 NOTE — Patient Instructions (Signed)
Stay on Flonase to keep the ears open.   Let us know if you need anything.

## 2023-02-20 ENCOUNTER — Other Ambulatory Visit: Payer: Self-pay | Admitting: Family Medicine

## 2023-02-20 DIAGNOSIS — R7301 Impaired fasting glucose: Secondary | ICD-10-CM

## 2023-03-04 ENCOUNTER — Ambulatory Visit (HOSPITAL_BASED_OUTPATIENT_CLINIC_OR_DEPARTMENT_OTHER): Payer: PPO | Admitting: Pulmonary Disease

## 2023-03-04 ENCOUNTER — Encounter (HOSPITAL_BASED_OUTPATIENT_CLINIC_OR_DEPARTMENT_OTHER): Payer: Self-pay | Admitting: Pulmonary Disease

## 2023-03-04 VITALS — BP 156/78 | HR 72 | Ht 68.0 in | Wt 218.0 lb

## 2023-03-04 DIAGNOSIS — G4733 Obstructive sleep apnea (adult) (pediatric): Secondary | ICD-10-CM

## 2023-03-04 NOTE — Assessment & Plan Note (Signed)
CPAP download was reviewed which shows excellent control of events on auto settings 5 to 12 cm with average pressure of 9 maximum history of 10 cm. We will change him to auto settings 8 to 12 cm.  He has excellent compliance close to 8 hours per night without a single missed night and leak is minimal. Overall CPAP has certainly helped improve his daytime somnolence and fatigue  Weight loss encouraged, compliance with goal of at least 4-6 hrs every night is the expectation. Advised against medications with sedative side effects Cautioned against driving when sleepy - understanding that sleepiness will vary on a day to day basis

## 2023-03-04 NOTE — Progress Notes (Signed)
Subjective:    Patient ID: Brett Armstrong, male    DOB: 15-Dec-1940, 82 y.o.   MRN: [id number]  HPI  82 yo never smoker  for FU of OSA.   OSA was diagnosed in 2013 -was on auto CPAP with avg pressure 8 to 9 cm.  Last office visit 11/2022 we provided him with a new replacement AutoSet 11.  He really likes the machine.  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 Also denies presyncope, palpitations, heartburn, abdominal pain, nausea, vomiting, diarrhea or change in bowel or urinary habits, dysuria,hematuria, rash, arthralgias, visual complaints, headache, numbness weakness or ataxia.     Objective:   Physical Exam  Gen. Pleasant, obese, in no distress ENT - no lesions, no post nasal drip Neck: No JVD, no thyromegaly, no carotid bruits Lungs: no use of accessory muscles, no dullness to percussion, decreased without rales or rhonchi  Cardiovascular: Rhythm regular, heart sounds  normal, no murmurs or gallops, no peripheral edema Musculoskeletal: No deformities, no cyanosis or clubbing , no tremors       Assessment & Plan:

## 2023-03-04 NOTE — Patient Instructions (Signed)
X Change to autoCPAP 8-12 cm

## 2023-03-10 ENCOUNTER — Other Ambulatory Visit: Payer: Self-pay | Admitting: Family Medicine

## 2023-03-10 DIAGNOSIS — K219 Gastro-esophageal reflux disease without esophagitis: Secondary | ICD-10-CM

## 2023-03-11 DIAGNOSIS — G4733 Obstructive sleep apnea (adult) (pediatric): Secondary | ICD-10-CM | POA: Diagnosis not present

## 2023-04-10 DIAGNOSIS — G4733 Obstructive sleep apnea (adult) (pediatric): Secondary | ICD-10-CM | POA: Diagnosis not present

## 2023-05-11 DIAGNOSIS — G4733 Obstructive sleep apnea (adult) (pediatric): Secondary | ICD-10-CM | POA: Diagnosis not present

## 2023-05-21 ENCOUNTER — Encounter: Payer: Self-pay | Admitting: Family Medicine

## 2023-05-21 ENCOUNTER — Ambulatory Visit (INDEPENDENT_AMBULATORY_CARE_PROVIDER_SITE_OTHER): Payer: PPO | Admitting: Family Medicine

## 2023-05-21 VITALS — BP 126/74 | HR 74 | Temp 97.8°F | Resp 16 | Ht 68.0 in | Wt 226.8 lb

## 2023-05-21 DIAGNOSIS — Z7984 Long term (current) use of oral hypoglycemic drugs: Secondary | ICD-10-CM

## 2023-05-21 DIAGNOSIS — I10 Essential (primary) hypertension: Secondary | ICD-10-CM | POA: Diagnosis not present

## 2023-05-21 DIAGNOSIS — E1165 Type 2 diabetes mellitus with hyperglycemia: Secondary | ICD-10-CM | POA: Diagnosis not present

## 2023-05-21 DIAGNOSIS — E039 Hypothyroidism, unspecified: Secondary | ICD-10-CM | POA: Diagnosis not present

## 2023-05-21 DIAGNOSIS — E7800 Pure hypercholesterolemia, unspecified: Secondary | ICD-10-CM | POA: Diagnosis not present

## 2023-05-21 NOTE — Patient Instructions (Addendum)
Give us 2-3 business days to get the results of your labs back.   Keep the diet clean and stay active.  Let us know if you need anything.  EXERCISES  RANGE OF MOTION (ROM) AND STRETCHING EXERCISES These exercises may help you when beginning to rehabilitate your injury. While completing these exercises, remember:  Restoring tissue flexibility helps normal motion to return to the joints. This allows healthier, less painful movement and activity. An effective stretch should be held for at least 30 seconds. A stretch should never be painful. You should only feel a gentle lengthening or release in the stretched tissue.  ROM - Pendulum Bend at the waist so that your right / left arm falls away from your body. Support yourself with your opposite hand on a solid surface, such as a table or a countertop. Your right / left arm should be perpendicular to the ground. If it is not perpendicular, you need to lean over farther. Relax the muscles in your right / left arm and shoulder as much as possible. Gently sway your hips and trunk so they move your right / left arm without any use of your right / left shoulder muscles. Progress your movements so that your right / left arm moves side to side, then forward and backward, and finally, both clockwise and counterclockwise. Complete 10-15 repetitions in each direction. Many people use this exercise to relieve discomfort in their shoulder as well as to gain range of motion. Repeat 2 times. Complete this exercise 3 times per week.  STRETCH - Flexion, Standing Stand with good posture. With an underhand grip on your right / left hand and an overhand grip on the opposite hand, grasp a broomstick or cane so that your hands are a little more than shoulder-width apart. Keeping your right / left elbow straight and shoulder muscles relaxed, push the stick with your opposite hand to raise your right / left arm in front of your body and then overhead. Raise your arm until  you feel a stretch in your right / left shoulder, but before you have increased shoulder pain. Try to avoid shrugging your right / left shoulder as your arm rises by keeping your shoulder blade tucked down and toward your mid-back spine. Hold 30 seconds. Slowly return to the starting position. Repeat 2 times. Complete this exercise 3 times per week.  STRETCH - Internal Rotation Place your right / left hand behind your back, palm-up. Throw a towel or belt over your opposite shoulder. Grasp the towel/belt with your right / left hand. While keeping an upright posture, gently pull up on the towel/belt until you feel a stretch in the front of your right / left shoulder. Avoid shrugging your right / left shoulder as your arm rises by keeping your shoulder blade tucked down and toward your mid-back spine. Hold 30. Release the stretch by lowering your opposite hand. Repeat 2 times. Complete this exercise 3 times per week.  STRETCH - External Rotation and Abduction Stagger your stance through a doorframe. It does not matter which foot is forward. As instructed by your physician, physical therapist or athletic trainer, place your hands: And forearms above your head and on the door frame. And forearms at head-height and on the door frame. At elbow-height and on the door frame. Keeping your head and chest upright and your stomach muscles tight to prevent over-extending your low-back, slowly shift your weight onto your front foot until you feel a stretch across your chest and/or in   the front of your shoulders. Hold 30 seconds. Shift your weight to your back foot to release the stretch. Repeat 2 times. Complete this stretch 3 times per week.   STRENGTHENING EXERCISES  These exercises may help you when beginning to rehabilitate your injury. They may resolve your symptoms with or without further involvement from your physician, physical therapist or athletic trainer. While completing these exercises,  remember:  Muscles can gain both the endurance and the strength needed for everyday activities through controlled exercises. Complete these exercises as instructed by your physician, physical therapist or athletic trainer. Progress the resistance and repetitions only as guided. You may experience muscle soreness or fatigue, but the pain or discomfort you are trying to eliminate should never worsen during these exercises. If this pain does worsen, stop and make certain you are following the directions exactly. If the pain is still present after adjustments, discontinue the exercise until you can discuss the trouble with your clinician. If advised by your physician, during your recovery, avoid activity or exercises which involve actions that place your right / left hand or elbow above your head or behind your back or head. These positions stress the tissues which are trying to heal.  STRENGTH - Scapular Depression and Adduction With good posture, sit on a firm chair. Supported your arms in front of you with pillows, arm rests or a table top. Have your elbows in line with the sides of your body. Gently draw your shoulder blades down and toward your mid-back spine. Gradually increase the tension without tensing the muscles along the top of your shoulders and the back of your neck. Hold for 3 seconds. Slowly release the tension and relax your muscles completely before completing the next repetition. After you have practiced this exercise, remove the arm support and complete it in standing as well as sitting. Repeat 2 times. Complete this exercise 3 times per week.   STRENGTH - External Rotators Secure a rubber exercise band/tubing to a fixed object so that it is at the same height as your right / left elbow when you are standing or sitting on a firm surface. Stand or sit so that the secured exercise band/tubing is at your side that is not injured. Bend your elbow 90 degrees. Place a folded towel or small  pillow under your right / left arm so that your elbow is a few inches away from your side. Keeping the tension on the exercise band/tubing, pull it away from your body, as if pivoting on your elbow. Be sure to keep your body steady so that the movement is only coming from your shoulder rotating. Hold 3 seconds. Release the tension in a controlled manner as you return to the starting position. Repeat 2 times. Complete this exercise 3 times per week.   STRENGTH - Supraspinatus Stand or sit with good posture. Grasp a 2-3 lb weight or an exercise band/tubing so that your hand is "thumbs-up," like when you shake hands. Slowly lift your right / left hand from your thigh into the air, traveling about 30 degrees from straight out at your side. Lift your hand to shoulder height or as far as you can without increasing any shoulder pain. Initially, many people do not lift their hands above shoulder height. Avoid shrugging your right / left shoulder as your arm rises by keeping your shoulder blade tucked down and toward your mid-back spine. Hold for 3 seconds. Control the descent of your hand as you slowly return to your starting   position. Repeat 2 times. Complete this exercise 3 times per week.   STRENGTH - Shoulder Extensors Secure a rubber exercise band/tubing so that it is at the height of your shoulders when you are either standing or sitting on a firm arm-less chair. With a thumbs-up grip, grasp an end of the band/tubing in each hand. Straighten your elbows and lift your hands straight in front of you at shoulder height. Step back away from the secured end of band/tubing until it becomes tense. Squeezing your shoulder blades together, pull your hands down to the sides of your thighs. Do not allow your hands to go behind you. Hold for 3 seconds. Slowly ease the tension on the band/tubing as you reverse the directions and return to the starting position. Repeat 2 times. Complete this exercise 3 times per  week.   STRENGTH - Scapular Retractors Secure a rubber exercise band/tubing so that it is at the height of your shoulders when you are either standing or sitting on a firm arm-less chair. With a palm-down grip, grasp an end of the band/tubing in each hand. Straighten your elbows and lift your hands straight in front of you at shoulder height. Step back away from the secured end of band/tubing until it becomes tense. Squeezing your shoulder blades together, draw your elbows back as you bend them. Keep your upper arm lifted away from your body throughout the exercise. Hold 3 seconds. Slowly ease the tension on the band/tubing as you reverse the directions and return to the starting position. Repeat 2 times. Complete this exercise 3 times per week.  STRENGTH - Scapular Depressors Find a sturdy chair without wheels, such as a from a dining room table. Keeping your feet on the floor, lift your bottom from the seat and lock your elbows. Keeping your elbows straight, allow gravity to pull your body weight down. Your shoulders will rise toward your ears. Raise your body against gravity by drawing your shoulder blades down your back, shortening the distance between your shoulders and ears. Although your feet should always maintain contact with the floor, your feet should progressively support less body weight as you get stronger. Hold 3 seconds. In a controlled and slow manner, lower your body weight to begin the next repetition. Repeat 2 times. Complete this exercise 3 times per week.    This information is not intended to replace advice given to you by your health care provider. Make sure you discuss any questions you have with your health care provider.   Document Released: 04/17/2005 Document Revised: 06/24/2014 Document Reviewed: 09/15/2008 Elsevier Interactive Patient Education 2016 [employer]  

## 2023-05-21 NOTE — Progress Notes (Signed)
Subjective:   Chief Complaint  Patient presents with   Follow-up    Follow up    Brett Armstrong is a 82 y.o. male here for follow-up of diabetes.   Zymiere does not routinely check his sugars.  Patient does not require insulin.   Medications include: Metformin XR mg daily Diet is healthy.  Exercise: Walking  Hypertension Patient presents for hypertension follow up. He does not monitor home blood pressures. He is compliant with medications-Coreg 12.5 mg twice daily, losartan-hydrochlorothiazide 100-25 mg daily. Patient has these side effects of medication: none Diet/exercise as above. No chest pain or shortness of breath.  Hyperlipidemia Patient presents for dyslipidemia follow up. Currently being treated with pravastatin 40 mg daily and compliance with treatment thus far has been good. He denies myalgias. Diet/exercise as above. The patient is not known to have coexisting coronary artery disease.  Hypothyroidism Patient presents for follow-up of hypothyroidism.  Reports compliance with medication-levothyroxine 88 mcg daily. Current symptoms include: denies fatigue, weight changes, heat/cold intolerance, bowel/skin changes or CVS symptoms He believes his dose should be not significantly changed  Past Medical History:  Diagnosis Date   Arthritis    KNEES, HANDS    Chronic kidney disease    HX OF KIDNEY STONES    Diabetes mellitus (HCC)    ED (erectile dysfunction)    GERD (gastroesophageal reflux disease)    History of DVT (deep vein thrombosis) 2016   Provoked by surgery; moved to pulm   Hyperlipidemia    Hypertension    Hypothyroid    Nephrolithiasis    OSA (obstructive sleep apnea)    Peripheral vascular disease (HCC)    left leg circulaton problem per pt    Rosacea    Vitamin B 12 deficiency      Related testing: Retinal exam: Done Pneumovax: done  Objective:  BP 126/74 (BP Location: Left Arm, Patient Position: Sitting, Cuff Size: Normal)   Pulse 74    Temp 97.8 F (36.6 C) (Oral)   Resp 16   Ht 5\' 8"  (1.727 m)   Wt 226 lb 12.8 oz (102.9 kg)   SpO2 96%   BMI 34.48 kg/m  General:  Well developed, well nourished, in no apparent distress Skin:  Warm, no pallor or diaphoresis Head:  Normocephalic, atraumatic Eyes:  Pupils equal and round, sclera anicteric without injection  Lungs:  CTAB, no access msc use Cardio:  RRR, no bruits, no LE edema Musculoskeletal:  Symmetrical muscle groups noted without atrophy or deformity Neuro:  Sensation intact to pinprick on feet Psych: Age appropriate judgment and insight  Assessment:   Type 2 diabetes mellitus with hyperglycemia, without long-term current use of insulin (HCC) - Plan: Comprehensive metabolic panel, Lipid panel, Hemoglobin A1c  Essential hypertension, benign  Pure hypercholesterolemia  Hypothyroidism, unspecified type - Plan: TSH, T4, free   Plan:   Chronic, stable.  Continue Forman XR 1000 mg daily.  Foot exam today.  Counseled on diet and exercise. Chronic, stable.  Continue Coreg 12.5 mg daily, losartan-hydrochlorothiazide 100-25 mg daily. Chronic, stable.  Continue pravastatin 40 mg daily. Chronic, stable.  Continue levothyroxine 88 mcg daily. F/u in 6 mo. The patient voiced understanding and agreement to the plan.  Brett Armstrong [place], DO 05/21/23 8:04 AM

## 2023-06-10 DIAGNOSIS — G4733 Obstructive sleep apnea (adult) (pediatric): Secondary | ICD-10-CM | POA: Diagnosis not present

## 2023-06-20 ENCOUNTER — Other Ambulatory Visit: Payer: Self-pay | Admitting: Family Medicine

## 2023-07-02 ENCOUNTER — Other Ambulatory Visit (INDEPENDENT_AMBULATORY_CARE_PROVIDER_SITE_OTHER): Payer: PPO

## 2023-07-02 ENCOUNTER — Other Ambulatory Visit: Payer: Self-pay

## 2023-07-02 DIAGNOSIS — E039 Hypothyroidism, unspecified: Secondary | ICD-10-CM | POA: Diagnosis not present

## 2023-07-02 DIAGNOSIS — E1165 Type 2 diabetes mellitus with hyperglycemia: Secondary | ICD-10-CM | POA: Diagnosis not present

## 2023-07-02 DIAGNOSIS — E876 Hypokalemia: Secondary | ICD-10-CM

## 2023-07-02 LAB — COMPREHENSIVE METABOLIC PANEL
ALT: 24 U/L (ref 0–53)
AST: 24 U/L (ref 0–37)
Albumin: 4.6 g/dL (ref 3.5–5.2)
Alkaline Phosphatase: 50 U/L (ref 39–117)
BUN: 19 mg/dL (ref 6–23)
CO2: 35 meq/L — ABNORMAL HIGH (ref 19–32)
Calcium: 9.7 mg/dL (ref 8.4–10.5)
Chloride: 98 meq/L (ref 96–112)
Creatinine, Ser: 0.99 mg/dL (ref 0.40–1.50)
GFR: 72.06 mL/min (ref >60.00)
Glucose, Bld: 109 mg/dL — ABNORMAL HIGH (ref 70–99)
Potassium: 3.2 meq/L — ABNORMAL LOW (ref 3.5–5.1)
Sodium: 140 meq/L (ref 135–145)
Total Bilirubin: 0.7 mg/dL (ref 0.2–1.2)
Total Protein: 7.1 g/dL (ref 6.0–8.3)

## 2023-07-02 LAB — T4, FREE: Free T4: 0.98 ng/dL (ref 0.60–1.60)

## 2023-07-02 LAB — LIPID PANEL
Cholesterol: 147 mg/dL (ref 0–200)
HDL: 42.9 mg/dL (ref >39.00)
LDL Cholesterol: 77 mg/dL (ref 0–99)
NonHDL: 104.28
Total CHOL/HDL Ratio: 3
Triglycerides: 135 mg/dL (ref 0.0–149.0)
VLDL: 27 mg/dL (ref 0.0–40.0)

## 2023-07-02 LAB — HEMOGLOBIN A1C: Hgb A1c MFr Bld: 6.8 % — ABNORMAL HIGH (ref 4.6–6.5)

## 2023-07-02 LAB — TSH: TSH: 1.93 u[IU]/mL (ref 0.35–5.50)

## 2023-07-03 ENCOUNTER — Telehealth: Payer: Self-pay

## 2023-07-03 NOTE — Telephone Encounter (Signed)
Copied from CRM [id number]. Topic: Clinical - Lab/Test Results >> Jul 03, 2023  2:08 PM Kathryne Eriksson wrote: Reason for CRM: Blood Work >> Jul 03, 2023  2:10 PM Kathryne Eriksson wrote: Patient states he had blood work done the other day and that Dr.Wendling wanted to speak with him in regards to it. Patient states he can be reached at his home number [phone]

## 2023-07-04 NOTE — Telephone Encounter (Signed)
Lab appt scheduled and labs ordered

## 2023-07-09 ENCOUNTER — Encounter: Payer: Self-pay | Admitting: Family Medicine

## 2023-07-09 ENCOUNTER — Other Ambulatory Visit (INDEPENDENT_AMBULATORY_CARE_PROVIDER_SITE_OTHER): Payer: PPO

## 2023-07-09 DIAGNOSIS — E876 Hypokalemia: Secondary | ICD-10-CM

## 2023-07-09 LAB — BASIC METABOLIC PANEL
BUN: 15 mg/dL (ref 6–23)
CO2: 35 meq/L — ABNORMAL HIGH (ref 19–32)
Calcium: 9.2 mg/dL (ref 8.4–10.5)
Chloride: 99 meq/L (ref 96–112)
Creatinine, Ser: 0.99 mg/dL (ref 0.40–1.50)
GFR: 72.05 mL/min (ref >60.00)
Glucose, Bld: 101 mg/dL — ABNORMAL HIGH (ref 70–99)
Potassium: 4 meq/L (ref 3.5–5.1)
Sodium: 140 meq/L (ref 135–145)

## 2023-07-09 LAB — MAGNESIUM: Magnesium: 2.3 mg/dL (ref 1.5–2.5)

## 2023-07-11 DIAGNOSIS — G4733 Obstructive sleep apnea (adult) (pediatric): Secondary | ICD-10-CM | POA: Diagnosis not present

## 2023-07-22 DIAGNOSIS — C44722 Squamous cell carcinoma of skin of right lower limb, including hip: Secondary | ICD-10-CM | POA: Diagnosis not present

## 2023-07-22 DIAGNOSIS — D485 Neoplasm of uncertain behavior of skin: Secondary | ICD-10-CM | POA: Diagnosis not present

## 2023-07-22 DIAGNOSIS — L814 Other melanin hyperpigmentation: Secondary | ICD-10-CM | POA: Diagnosis not present

## 2023-07-22 DIAGNOSIS — L820 Inflamed seborrheic keratosis: Secondary | ICD-10-CM | POA: Diagnosis not present

## 2023-07-22 DIAGNOSIS — L821 Other seborrheic keratosis: Secondary | ICD-10-CM | POA: Diagnosis not present

## 2023-07-22 DIAGNOSIS — L578 Other skin changes due to chronic exposure to nonionizing radiation: Secondary | ICD-10-CM | POA: Diagnosis not present

## 2023-07-22 DIAGNOSIS — W908XXS Exposure to other nonionizing radiation, sequela: Secondary | ICD-10-CM | POA: Diagnosis not present

## 2023-07-22 DIAGNOSIS — L570 Actinic keratosis: Secondary | ICD-10-CM | POA: Diagnosis not present

## 2023-08-11 DIAGNOSIS — G4733 Obstructive sleep apnea (adult) (pediatric): Secondary | ICD-10-CM | POA: Diagnosis not present

## 2023-08-28 DIAGNOSIS — G4733 Obstructive sleep apnea (adult) (pediatric): Secondary | ICD-10-CM | POA: Diagnosis not present

## 2023-09-08 DIAGNOSIS — G4733 Obstructive sleep apnea (adult) (pediatric): Secondary | ICD-10-CM | POA: Diagnosis not present

## 2023-09-24 ENCOUNTER — Other Ambulatory Visit: Payer: Self-pay | Admitting: Family Medicine

## 2023-09-24 DIAGNOSIS — M25511 Pain in right shoulder: Secondary | ICD-10-CM | POA: Diagnosis not present

## 2023-09-24 DIAGNOSIS — E785 Hyperlipidemia, unspecified: Secondary | ICD-10-CM

## 2023-09-24 DIAGNOSIS — R7301 Impaired fasting glucose: Secondary | ICD-10-CM

## 2023-09-24 DIAGNOSIS — M7541 Impingement syndrome of right shoulder: Secondary | ICD-10-CM | POA: Diagnosis not present

## 2023-09-24 DIAGNOSIS — S40011A Contusion of right shoulder, initial encounter: Secondary | ICD-10-CM | POA: Diagnosis not present

## 2023-10-09 DIAGNOSIS — G4733 Obstructive sleep apnea (adult) (pediatric): Secondary | ICD-10-CM | POA: Diagnosis not present

## 2023-11-08 DIAGNOSIS — G4733 Obstructive sleep apnea (adult) (pediatric): Secondary | ICD-10-CM | POA: Diagnosis not present

## 2023-11-19 ENCOUNTER — Encounter: Payer: Self-pay | Admitting: Family Medicine

## 2023-11-19 ENCOUNTER — Other Ambulatory Visit: Payer: Self-pay

## 2023-11-19 ENCOUNTER — Ambulatory Visit (INDEPENDENT_AMBULATORY_CARE_PROVIDER_SITE_OTHER): Payer: PPO | Admitting: Family Medicine

## 2023-11-19 ENCOUNTER — Ambulatory Visit: Payer: Self-pay | Admitting: Family Medicine

## 2023-11-19 VITALS — BP 136/76 | HR 78 | Temp 98.0°F | Resp 16 | Ht 68.0 in | Wt 216.0 lb

## 2023-11-19 DIAGNOSIS — Z0000 Encounter for general adult medical examination without abnormal findings: Secondary | ICD-10-CM | POA: Diagnosis not present

## 2023-11-19 DIAGNOSIS — E876 Hypokalemia: Secondary | ICD-10-CM

## 2023-11-19 DIAGNOSIS — K219 Gastro-esophageal reflux disease without esophagitis: Secondary | ICD-10-CM

## 2023-11-19 DIAGNOSIS — E1165 Type 2 diabetes mellitus with hyperglycemia: Secondary | ICD-10-CM | POA: Diagnosis not present

## 2023-11-19 DIAGNOSIS — E039 Hypothyroidism, unspecified: Secondary | ICD-10-CM

## 2023-11-19 LAB — HEMOGLOBIN A1C: Hgb A1c MFr Bld: 6.6 % — ABNORMAL HIGH (ref 4.6–6.5)

## 2023-11-19 LAB — COMPREHENSIVE METABOLIC PANEL WITH GFR
ALT: 19 U/L (ref 0–53)
AST: 19 U/L (ref 0–37)
Albumin: 4.4 g/dL (ref 3.5–5.2)
Alkaline Phosphatase: 72 U/L (ref 39–117)
BUN: 17 mg/dL (ref 6–23)
CO2: 35 meq/L — ABNORMAL HIGH (ref 19–32)
Calcium: 9.6 mg/dL (ref 8.4–10.5)
Chloride: 97 meq/L (ref 96–112)
Creatinine, Ser: 0.99 mg/dL (ref 0.40–1.50)
GFR: 71.87 mL/min (ref >60.00)
Glucose, Bld: 102 mg/dL — ABNORMAL HIGH (ref 70–99)
Potassium: 3.3 meq/L — ABNORMAL LOW (ref 3.5–5.1)
Sodium: 140 meq/L (ref 135–145)
Total Bilirubin: 0.8 mg/dL (ref 0.2–1.2)
Total Protein: 7.4 g/dL (ref 6.0–8.3)

## 2023-11-19 LAB — MICROALBUMIN / CREATININE URINE RATIO
Creatinine,U: 173.5 mg/dL
Microalb Creat Ratio: 12.3 mg/g (ref 0.0–30.0)
Microalb, Ur: 2.1 mg/dL — ABNORMAL HIGH (ref 0.0–1.9)

## 2023-11-19 LAB — TSH: TSH: 1.16 u[IU]/mL (ref 0.35–5.50)

## 2023-11-19 LAB — LIPID PANEL
Cholesterol: 147 mg/dL (ref 0–200)
HDL: 40.7 mg/dL (ref >39.00)
LDL Cholesterol: 83 mg/dL (ref 0–99)
NonHDL: 106.7
Total CHOL/HDL Ratio: 4
Triglycerides: 120 mg/dL (ref 0.0–149.0)
VLDL: 24 mg/dL (ref 0.0–40.0)

## 2023-11-19 LAB — CBC
HCT: 43.7 % (ref 39.0–52.0)
Hemoglobin: 14.7 g/dL (ref 13.0–17.0)
MCHC: 33.7 g/dL (ref 30.0–36.0)
MCV: 94.7 fl (ref 78.0–100.0)
Platelets: 184 10*3/uL (ref 150.0–400.0)
RBC: 4.62 Mil/uL (ref 4.22–5.81)
RDW: 13.7 % (ref 11.5–15.5)
WBC: 7.3 10*3/uL (ref 4.0–10.5)

## 2023-11-19 LAB — T4, FREE: Free T4: 0.95 ng/dL (ref 0.60–1.60)

## 2023-11-19 MED ORDER — OMEPRAZOLE 20 MG PO CPDR: 20.0000 mg | DELAYED_RELEASE_CAPSULE | Freq: Two times a day (BID) | ORAL | 1 refills | Status: DC | PRN

## 2023-11-19 NOTE — Patient Instructions (Signed)
 Give [place] 2-3 business days to get the results of your labs back.   Keep the diet clean and stay active.  Let us know if you need anything.

## 2023-11-19 NOTE — Progress Notes (Signed)
 Chief Complaint  Patient presents with   Annual Exam    CPE    Well Male Brett Armstrong is here for a complete physical.   His last physical was >1 year ago.  Current diet: in general, a "healthy" diet.   Current exercise: walking Weight trend: intentionally losing Fatigue out of ordinary? Not unless exposed to heat. Seat belt? Yes.   Advanced directive? Yes  Health maintenance Shingrix- Yes Tetanus- Yes Pneumonia vaccine- Yes  Past Medical History:  Diagnosis Date   Arthritis    KNEES, HANDS    Chronic kidney disease    HX OF KIDNEY STONES    Diabetes mellitus (HCC)    ED (erectile dysfunction)    GERD (gastroesophageal reflux disease)    History of DVT (deep vein thrombosis) 2016   Provoked by surgery; moved to pulm   Hyperlipidemia    Hypertension    Hypothyroid    Nephrolithiasis    OSA (obstructive sleep apnea)    Peripheral vascular disease (HCC)    left leg circulaton problem per pt    Rosacea    Vitamin B 12 deficiency      Past Surgical History:  Procedure Laterality Date   APPENDECTOMY     EYE SURGERY Left 07/19/2019   Cataract removal   HERNIA REPAIR     UMBILICAL    LITHOTRIPSY     X 4   OTHER SURGICAL HISTORY     TUMOR REMOVED RIGHT FACE - BENIGN    OTHER SURGICAL HISTORY     small facial tumor removal 1970s   TOTAL KNEE ARTHROPLASTY  06/24/2011   Procedure: TOTAL KNEE ARTHROPLASTY;  Surgeon: Bevin Bucks;  Location: [facility];  Service: Orthopedics;  Laterality: Right;   TOTAL KNEE ARTHROPLASTY  09/03/2011   Procedure: TOTAL KNEE ARTHROPLASTY;  Surgeon: Bevin Bucks, MD;  Location: [facility];  Service: Orthopedics;  Laterality: Left;    Medications  Current Outpatient Medications on File Prior to Visit  Medication Sig Dispense Refill   aspirin  EC 325 MG tablet Take 325 mg by mouth daily.     carvedilol  (COREG ) 12.5 MG tablet TAKE 1 TABLET BY MOUTH TWICE A DAY WITH A MEAL 180 tablet 1   Cholecalciferol  (VITAMIN D) 50 MCG (2000 UT) CAPS  Take 1 capsule by mouth daily.     levothyroxine  (SYNTHROID ) 88 MCG tablet TAKE 1 TABLET BY MOUTH DAILY BEFORE BREAKFAST 90 tablet 2   losartan -hydrochlorothiazide  (HYZAAR) 100-25 MG tablet Take 1 tablet by mouth every morning. 90 tablet 1   metFORMIN  (GLUCOPHAGE -XR) 500 MG 24 hr tablet TAKE 2 TABLET BY MOUTH EVERY EVENING 180 tablet 1   Omega-3 Fatty Acids (FISH OIL) 1200 MG CAPS Take by mouth.     omeprazole  (PRILOSEC) 20 MG capsule Take 1 capsule (20 mg total) by mouth 2 (two) times daily as needed. 180 capsule 1   pravastatin  (PRAVACHOL ) 40 MG tablet TAKE 1 TABLET BY MOUTH EVERY NIGHT AT BEDTIME 90 tablet 2   vitamin B-12 (CYANOCOBALAMIN ) 1000 MCG tablet Take 1,000 mcg by mouth daily.      Allergies Allergies  Allergen Reactions   Methocarbamol  [Methocarbamol ] Nausea And Vomiting    Family History Family History  Problem Relation Age of Onset   Alzheimer's disease Mother    CAD Mother    Heart disease Mother    Lung disease Father 33       Pulmonary Embolus   Diabetes Maternal Grandfather    Heart disease Paternal Grandfather  Thyroid  disease Daughter     Review of Systems: Constitutional:  no fevers Eye:  no recent significant change in vision Ears:  No changes in hearing Nose/Mouth/Throat:  no complaints of nasal congestion, no sore throat Cardiovascular: no chest pain Respiratory:  No shortness of breath Gastrointestinal:  No change in bowel habits GU:  No frequency Integumentary:  no abnormal skin lesions reported Neurologic:  no headaches Endocrine:  denies unexplained weight changes  Exam BP 136/76 (BP Location: Left Arm, Patient Position: Sitting)   Pulse 78   Temp 98 F (36.7 C) (Oral)   Resp 16   Ht 5\' 8"  (1.727 m)   Wt 216 lb (98 kg)   SpO2 98%   BMI 32.84 kg/m  General:  well developed, well nourished, in no apparent distress Skin:  no significant moles, warts, or growths Head:  no masses, lesions, or tenderness Eyes:  pupils equal and round,  sclera anicteric without injection Ears:  canals without lesions, TMs shiny without retraction, no obvious effusion, no erythema Nose:  nares patent, mucosa normal Throat/Pharynx:  lips and gingiva without lesion; tongue and uvula midline; non-inflamed pharynx; no exudates or postnasal drainage Lungs:  clear to auscultation, breath sounds equal bilaterally, no respiratory distress Cardio:  regular rate and rhythm, no LE edema or bruits Rectal: Deferred GI: BS+, S, NT, ND, no masses or organomegaly Musculoskeletal:  symmetrical muscle groups noted without atrophy or deformity Neuro:  gait normal; deep tendon reflexes normal and symmetric Psych: well oriented with normal range of affect and appropriate judgment/insight  Assessment and Plan  Well adult exam  Type 2 diabetes mellitus with hyperglycemia, without long-term current use of insulin  (HCC) - Plan: CBC, Comprehensive metabolic panel with GFR, Hemoglobin A1c, Microalbumin / creatinine urine ratio, Lipid panel  Hypothyroidism, unspecified type - Plan: T4, free, TSH  GERD (gastroesophageal reflux disease) - Plan: omeprazole  (PRILOSEC) 20 MG capsule   Well 83 y.o. male. Counseled on diet and exercise. Other orders as above. Follow up in 6 mo.  The patient voiced understanding and agreement to the plan.  Shellie Dials [place], DO 11/19/23 8:05 AM

## 2023-11-21 ENCOUNTER — Other Ambulatory Visit (INDEPENDENT_AMBULATORY_CARE_PROVIDER_SITE_OTHER)

## 2023-11-21 DIAGNOSIS — E876 Hypokalemia: Secondary | ICD-10-CM | POA: Diagnosis not present

## 2023-11-21 NOTE — Addendum Note (Signed)
 Addended by: Marigene Shoulder on: 11/21/2023 03:04 PM   Modules accepted: Orders

## 2023-11-22 ENCOUNTER — Ambulatory Visit: Payer: Self-pay | Admitting: Family Medicine

## 2023-11-22 LAB — BASIC METABOLIC PANEL WITH GFR
BUN: 17 mg/dL (ref 7–25)
CO2: 29 mmol/L (ref 20–32)
Calcium: 9.7 mg/dL (ref 8.6–10.3)
Chloride: 101 mmol/L (ref 98–110)
Creat: 1.07 mg/dL (ref 0.70–1.22)
Glucose, Bld: 131 mg/dL — ABNORMAL HIGH (ref 65–99)
Potassium: 3.3 mmol/L — ABNORMAL LOW (ref 3.5–5.3)
Sodium: 142 mmol/L (ref 135–146)
eGFR: 70 mL/min/{1.73_m2} (ref >=60)

## 2023-11-22 LAB — MAGNESIUM: Magnesium: 2.3 mg/dL (ref 1.5–2.5)

## 2023-11-22 MED ORDER — POTASSIUM CHLORIDE ER 10 MEQ PO TBCR: 10.0000 meq | EXTENDED_RELEASE_TABLET | Freq: Every day | ORAL | 0 refills | Status: DC

## 2023-11-24 ENCOUNTER — Other Ambulatory Visit: Payer: Self-pay

## 2023-11-24 DIAGNOSIS — Z8639 Personal history of other endocrine, nutritional and metabolic disease: Secondary | ICD-10-CM

## 2023-11-24 NOTE — Telephone Encounter (Signed)
 Called pt not able to LVM but was trying to schedule lab appt for this week, labs order placed. Just need to schedule  the appt.

## 2023-11-24 NOTE — Telephone Encounter (Signed)
 Called pt was advised and appt scheduled.

## 2023-11-25 ENCOUNTER — Other Ambulatory Visit: Payer: Self-pay | Admitting: Family Medicine

## 2023-11-27 ENCOUNTER — Other Ambulatory Visit (INDEPENDENT_AMBULATORY_CARE_PROVIDER_SITE_OTHER)

## 2023-11-27 DIAGNOSIS — Z8639 Personal history of other endocrine, nutritional and metabolic disease: Secondary | ICD-10-CM | POA: Diagnosis not present

## 2023-11-27 LAB — BASIC METABOLIC PANEL WITH GFR
BUN: 13 mg/dL (ref 7–25)
CO2: 31 mmol/L (ref 20–32)
Calcium: 9.6 mg/dL (ref 8.6–10.3)
Chloride: 98 mmol/L (ref 98–110)
Creat: 1.06 mg/dL (ref 0.70–1.22)
Glucose, Bld: 103 mg/dL — ABNORMAL HIGH (ref 65–99)
Potassium: 3.7 mmol/L (ref 3.5–5.3)
Sodium: 139 mmol/L (ref 135–146)
eGFR: 71 mL/min/{1.73_m2} (ref >=60)

## 2023-11-28 ENCOUNTER — Ambulatory Visit: Payer: Self-pay | Admitting: Family Medicine

## 2023-12-01 ENCOUNTER — Other Ambulatory Visit: Payer: Self-pay

## 2023-12-01 ENCOUNTER — Telehealth: Payer: Self-pay | Admitting: Family Medicine

## 2023-12-01 DIAGNOSIS — E876 Hypokalemia: Secondary | ICD-10-CM

## 2023-12-01 NOTE — Telephone Encounter (Signed)
 Copied from CRM [id number]. Topic: Medicare AWV >> Dec 01, 2023  2:37 PM Juliana Ocean wrote: Reason for CRM: LVM 12/01/2023 to schedule AWV. Please schedule Virtual or Telehealth visits ONLY.   Rosalee Collins; Care Guide Ambulatory Clinical Support Story l [facility] Direct Dial: [phone]

## 2023-12-02 ENCOUNTER — Other Ambulatory Visit: Payer: Self-pay | Admitting: Family Medicine

## 2023-12-09 DIAGNOSIS — G4733 Obstructive sleep apnea (adult) (pediatric): Secondary | ICD-10-CM | POA: Diagnosis not present

## 2023-12-23 ENCOUNTER — Other Ambulatory Visit (INDEPENDENT_AMBULATORY_CARE_PROVIDER_SITE_OTHER)

## 2023-12-23 DIAGNOSIS — E876 Hypokalemia: Secondary | ICD-10-CM | POA: Diagnosis not present

## 2023-12-23 LAB — BASIC METABOLIC PANEL WITH GFR
BUN: 19 mg/dL (ref 7–25)
CO2: 35 mmol/L — ABNORMAL HIGH (ref 20–32)
Calcium: 9.4 mg/dL (ref 8.6–10.3)
Chloride: 98 mmol/L (ref 98–110)
Creat: 1.09 mg/dL (ref 0.70–1.22)
Glucose, Bld: 110 mg/dL — ABNORMAL HIGH (ref 65–99)
Potassium: 3.8 mmol/L (ref 3.5–5.3)
Sodium: 139 mmol/L (ref 135–146)
eGFR: 69 mL/min/1.73m2 (ref >=60)

## 2023-12-24 ENCOUNTER — Ambulatory Visit: Payer: Self-pay | Admitting: Family Medicine

## 2024-01-07 DIAGNOSIS — H5213 Myopia, bilateral: Secondary | ICD-10-CM | POA: Diagnosis not present

## 2024-01-07 DIAGNOSIS — Z961 Presence of intraocular lens: Secondary | ICD-10-CM | POA: Diagnosis not present

## 2024-01-07 DIAGNOSIS — H43813 Vitreous degeneration, bilateral: Secondary | ICD-10-CM | POA: Diagnosis not present

## 2024-01-07 DIAGNOSIS — E119 Type 2 diabetes mellitus without complications: Secondary | ICD-10-CM | POA: Diagnosis not present

## 2024-01-07 DIAGNOSIS — H26493 Other secondary cataract, bilateral: Secondary | ICD-10-CM | POA: Diagnosis not present

## 2024-01-07 DIAGNOSIS — H524 Presbyopia: Secondary | ICD-10-CM | POA: Diagnosis not present

## 2024-01-07 DIAGNOSIS — H52223 Regular astigmatism, bilateral: Secondary | ICD-10-CM | POA: Diagnosis not present

## 2024-01-07 LAB — HM DIABETES EYE EXAM

## 2024-01-08 DIAGNOSIS — G4733 Obstructive sleep apnea (adult) (pediatric): Secondary | ICD-10-CM | POA: Diagnosis not present

## 2024-01-27 DIAGNOSIS — Z08 Encounter for follow-up examination after completed treatment for malignant neoplasm: Secondary | ICD-10-CM | POA: Diagnosis not present

## 2024-01-27 DIAGNOSIS — L578 Other skin changes due to chronic exposure to nonionizing radiation: Secondary | ICD-10-CM | POA: Diagnosis not present

## 2024-01-27 DIAGNOSIS — L570 Actinic keratosis: Secondary | ICD-10-CM | POA: Diagnosis not present

## 2024-01-27 DIAGNOSIS — L814 Other melanin hyperpigmentation: Secondary | ICD-10-CM | POA: Diagnosis not present

## 2024-01-27 DIAGNOSIS — L821 Other seborrheic keratosis: Secondary | ICD-10-CM | POA: Diagnosis not present

## 2024-01-27 DIAGNOSIS — Z85828 Personal history of other malignant neoplasm of skin: Secondary | ICD-10-CM | POA: Diagnosis not present

## 2024-01-27 DIAGNOSIS — D1801 Hemangioma of skin and subcutaneous tissue: Secondary | ICD-10-CM | POA: Diagnosis not present

## 2024-01-27 DIAGNOSIS — L820 Inflamed seborrheic keratosis: Secondary | ICD-10-CM | POA: Diagnosis not present

## 2024-02-10 ENCOUNTER — Ambulatory Visit (INDEPENDENT_AMBULATORY_CARE_PROVIDER_SITE_OTHER)

## 2024-02-10 VITALS — Ht 68.0 in | Wt 216.0 lb

## 2024-02-10 DIAGNOSIS — Z0000 Encounter for general adult medical examination without abnormal findings: Secondary | ICD-10-CM

## 2024-02-10 NOTE — Patient Instructions (Signed)
 Mr. Brett Armstrong , Thank you for taking time out of your busy schedule to complete your Annual Wellness Visit with me. I enjoyed our conversation and look forward to speaking with you again next year. I, as well as your care team,  appreciate your ongoing commitment to your health goals. Please review the following plan we discussed and let me know if I can assist you in the future. Your Game plan/ To Do List    Follow up Visits: We will see or speak with you next year for your Next Medicare AWV with our clinical staff Have you seen your provider in the last 6 months (3 months if uncontrolled diabetes)? Yes  Clinician Recommendations:  Aim for 30 minutes of exercise or brisk walking, 6-8 glasses of water, and 5 servings of fruits and vegetables each day.       This is a list of the screenings recommended for you:  Health Maintenance  Topic Date Due   COVID-19 Vaccine (6 - 2024-25 season) 05/21/2023   Flu Shot  01/16/2024   Complete foot exam   05/20/2024   Hemoglobin A1C  05/20/2024   Yearly kidney health urinalysis for diabetes  11/18/2024   Yearly kidney function blood test for diabetes  12/22/2024   Eye exam for diabetics  01/06/2025   Medicare Annual Wellness Visit  02/09/2025   DTaP/Tdap/Td vaccine (6 - Td or Tdap) 03/20/2032   Pneumococcal Vaccine for age over 78  Completed   Zoster (Shingles) Vaccine  Completed   HPV Vaccine  Aged Out   Meningitis B Vaccine  Aged Out   Colon Cancer Screening  Discontinued   Hepatitis C Screening  Discontinued    Advanced directives: (ACP Link)Information on Advanced Care Planning can be found at [place]  Secretary of [facility] Advance Health Care Directives Advance Health Care Directives. [email or web address]   Advance Care Planning is important because it:  [x]  Makes sure you receive the medical care that is consistent with your values, goals, and preferences  [x]  It provides guidance to your family and loved ones and reduces their decisional burden  about whether or not they are making the right decisions based on your wishes.  Follow the link provided in your after visit summary or read over the paperwork we have mailed to you to help you started getting your Advance Directives in place. If you need assistance in completing these, please reach out to us  so that we can help you!  See attachments for Preventive Care and Fall Prevention Tips.

## 2024-02-10 NOTE — Progress Notes (Signed)
 Subjective:   Brett Armstrong is a 83 y.o. who presents for a Medicare Wellness preventive visit.  As a reminder, Annual Wellness Visits don't include a physical exam, and some assessments may be limited, especially if this visit is performed virtually. We may recommend an in-person follow-up visit with your provider if needed.  Visit Complete: Virtual I connected with  Brett Armstrong on 02/10/24 by a audio enabled telemedicine application and verified that I am speaking with the correct person using two identifiers.  Patient Location: Home  Provider Location: Home Office  I discussed the limitations of evaluation and management by telemedicine. The patient expressed understanding and agreed to proceed.  Vital Signs: Because this visit was a virtual/telehealth visit, some criteria may be missing or patient reported. Any vitals not documented were not able to be obtained and vitals that have been documented are patient reported.  VideoDeclined- This patient declined Librarian, academic. Therefore the visit was completed with audio only.  Persons Participating in Visit: Patient.  AWV Questionnaire: No: Patient Medicare AWV questionnaire was not completed prior to this visit.  Cardiac Risk Factors include: advanced age (>7men, >59 women);diabetes mellitus;dyslipidemia;hypertension;male gender     Objective:    Today's Vitals   02/10/24 0932  Weight: 216 lb (98 kg)  Height: 5' 8 (1.727 m)   Body mass index is 32.84 kg/m.     02/10/2024    9:39 AM 02/05/2023    3:37 PM 01/24/2022    3:46 PM 01/18/2021    3:45 PM 08/24/2019    3:18 PM 09/03/2011    6:22 PM 08/27/2011    9:48 AM  Advanced Directives  Does Patient Have a Medical Advance Directive? Yes Yes Yes Yes Yes Patient has advance directive, copy not in chart  Patient has advance directive, copy in chart   Type of Advance Directive Healthcare Power of [place];Living will Healthcare Power of  [place];Living will Healthcare Power of [place];Living will Healthcare Power of [place];Living will Healthcare Power of [place];Living will  Healthcare Power of [place];Living will   Does patient want to make changes to medical advance directive? No - Patient declined No - Patient declined No - Patient declined  Yes (MAU/Ambulatory/Procedural Areas - Information given)    Copy of Healthcare Power of Attorney in Chart? Yes - validated most recent copy scanned in chart (See row information) Yes - validated most recent copy scanned in chart (See row information) No - copy requested Yes - validated most recent copy scanned in chart (See row information) Yes - validated most recent copy scanned in chart (See row information)    Pre-existing out of facility DNR order (yellow form or pink MOST form)      No  No      Data saved with a previous flowsheet row definition    Current Medications (verified) Outpatient Encounter Medications as of 02/10/2024  Medication Sig   aspirin  EC 325 MG tablet Take 325 mg by mouth daily.   carvedilol  (COREG ) 12.5 MG tablet TAKE 1 TABLET BY MOUTH TWICE A DAY WITH A MEAL   Cholecalciferol  (VITAMIN D) 50 MCG (2000 UT) CAPS Take 1 capsule by mouth daily.   levothyroxine  (SYNTHROID ) 88 MCG tablet TAKE 1 TABLET BY MOUTH DAILY BEFORE BREAKFAST   losartan -hydrochlorothiazide  (HYZAAR) 100-25 MG tablet Take 1 tablet by mouth every morning.   metFORMIN  (GLUCOPHAGE -XR) 500 MG 24 hr tablet TAKE 2 TABLET BY MOUTH EVERY EVENING   Omega-3 Fatty Acids (FISH OIL) 1200 MG  CAPS Take by mouth.   omeprazole  (PRILOSEC) 20 MG capsule Take 1 capsule (20 mg total) by mouth 2 (two) times daily as needed.   pravastatin  (PRAVACHOL ) 40 MG tablet TAKE 1 TABLET BY MOUTH EVERY NIGHT AT BEDTIME   vitamin B-12 (CYANOCOBALAMIN ) 1000 MCG tablet Take 1,000 mcg by mouth daily.   No facility-administered encounter medications on file as of 02/10/2024.    Allergies (verified) Methocarbamol   [methocarbamol ]   History: Past Medical History:  Diagnosis Date   Arthritis    KNEES, HANDS    Chronic kidney disease    HX OF KIDNEY STONES    Diabetes mellitus (HCC)    ED (erectile dysfunction)    GERD (gastroesophageal reflux disease)    History of DVT (deep vein thrombosis) 2016   Provoked by surgery; moved to pulm   Hyperlipidemia    Hypertension    Hypothyroid    Nephrolithiasis    OSA (obstructive sleep apnea)    Peripheral vascular disease (HCC)    left leg circulaton problem per pt    Rosacea    Vitamin B 12 deficiency    Past Surgical History:  Procedure Laterality Date   APPENDECTOMY     EYE SURGERY Left 07/19/2019   Cataract removal   HERNIA REPAIR     UMBILICAL    LITHOTRIPSY     X 4   OTHER SURGICAL HISTORY     TUMOR REMOVED RIGHT FACE - BENIGN    OTHER SURGICAL HISTORY     small facial tumor removal 1970s   TOTAL KNEE ARTHROPLASTY  06/24/2011   Procedure: TOTAL KNEE ARTHROPLASTY;  Surgeon: Donnice JONETTA Car;  Location: [facility];  Service: Orthopedics;  Laterality: Right;   TOTAL KNEE ARTHROPLASTY  09/03/2011   Procedure: TOTAL KNEE ARTHROPLASTY;  Surgeon: Donnice JONETTA Car, MD;  Location: [facility];  Service: Orthopedics;  Laterality: Left;   Family History  Problem Relation Age of Onset   Alzheimer's disease Mother    CAD Mother    Heart disease Mother    Lung disease Father 72       Pulmonary Embolus   Diabetes Maternal Grandfather    Heart disease Paternal Grandfather    Thyroid  disease Daughter    Social History   Socioeconomic History   Marital status: Married    Spouse name: Cy   Number of children: 2   Years of education: 12   Highest education level: Not on file  Occupational History   Not on file  Tobacco Use   Smoking status: Never   Smokeless tobacco: Never  Vaping Use   Vaping status: Never Used  Substance and Sexual Activity   Alcohol use: No   Drug use: No   Sexual activity: Yes    Partners: Female  Other Topics Concern    Not on file  Social History Narrative   ** Merged History Encounter **       Marital Status: Married Airline pilot)    Children:  Daughters (Montie, Darice)    Pets:  Dog (1)    Living Situation: Lives with spouse.   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[employer])    Education:  Engineer, agricultural    Tobacco Use/Exposure:  None    Alcohol Use:  Occasional   Drug Use:  None   Diet:  Regular   Exercise:  Active Job   Hobbies:  Architect, Therapist, music    Social Drivers of Health   Financial Resource Strain: Low Risk  (02/10/2024)  Overall Financial Resource Strain (CARDIA)    Difficulty of Paying Living Expenses: Not hard at all  Food Insecurity: No Food Insecurity (02/10/2024)   Hunger Vital Sign    Worried About Running Out of Food in the Last Year: Never true    Ran Out of Food in the Last Year: Never true  Transportation Needs: No Transportation Needs (02/10/2024)   PRAPARE - Administrator, Civil Service (Medical): No    Lack of Transportation (Non-Medical): No  Physical Activity: Sufficiently Active (02/10/2024)   Exercise Vital Sign    Days of Exercise per Week: 5 days    Minutes of Exercise per Session: 60 min  Stress: No Stress Concern Present (02/10/2024)   [employer] of Occupational Health - Occupational Stress Questionnaire    Feeling of Stress: Not at all  Social Connections: Socially Isolated (02/10/2024)   Social Connection and Isolation Panel    Frequency of Communication with Friends and Family: Once a week    Frequency of Social Gatherings with Friends and Family: Never    Attends Religious Services: Never    Database administrator or Organizations: No    Attends Engineer, structural: Never    Marital Status: Married    Tobacco Counseling Counseling given: Not Answered    Clinical Intake:  Pre-visit preparation completed: Yes  Pain : No/denies pain  Diabetes: Yes CBG done?: No Did pt. bring in CBG monitor from home?: No  Lab Results   Component Value Date   HGBA1C 6.6 (H) 11/19/2023   HGBA1C 6.8 (H) 07/02/2023   HGBA1C 6.4 11/13/2022     How often do you need to have someone help you when you read instructions, pamphlets, or other written materials from your doctor or pharmacy?: 1 - Never  Interpreter Needed?: No  Information entered by :: Charmaine Bloodgood LPN   Activities of Daily Living     02/10/2024    9:34 AM  In your present state of health, do you have any difficulty performing the following activities:  Hearing? 0  Vision? 0  Difficulty concentrating or making decisions? 0  Walking or climbing stairs? 0  Dressing or bathing? 0  Doing errands, shopping? 0  Preparing Food and eating ? N  Using the Toilet? N  In the past six months, have you accidently leaked urine? N  Do you have problems with loss of bowel control? N  Managing your Medications? N  Managing your Finances? N  Housekeeping or managing your Housekeeping? N    Patient Care Team: Frann Mabel Mt, DO as PCP - General (Family Medicine) Nancyann DOROTHA Ebbing, M.D., PA Jude Harden GAILS, MD as Consulting Physician (Pulmonary Disease)  I have updated your Care Teams any recent Medical Services you may have received from other providers in the past year.     Assessment:   This is a routine wellness examination for [place].  Hearing/Vision screen Hearing Screening - Comments:: Denies hearing difficulties   Vision Screening - Comments:: Wears rx glasses - up to date with routine eye exams with [facility]    Goals Addressed             This Visit's Progress    Patient Stated   On track    Drink more water       Depression Screen     02/10/2024    9:46 AM 11/19/2023    7:56 AM 05/21/2023    7:55 AM 02/05/2023    3:58  PM 11/13/2022    2:37 PM 06/28/2022    4:17 PM 01/24/2022    3:47 PM  PHQ 2/9 Scores  PHQ - 2 Score 0 0 0 0 0 0 0  PHQ- 9 Score     0      Fall Risk     02/10/2024    9:39 AM 11/19/2023    7:56 AM 05/21/2023     7:55 AM 02/05/2023    3:43 PM 11/13/2022    2:37 PM  Fall Risk   Falls in the past year? 0 0 0 0 0  Number falls in past yr: 0 0 0 0 0  Injury with Fall? 0 0 0 0 0  Risk for fall due to : No Fall Risks   No Fall Risks No Fall Risks  Follow up Falls prevention discussed;Education provided;Falls evaluation completed Falls evaluation completed Falls evaluation completed Falls evaluation completed Falls evaluation completed    MEDICARE RISK AT HOME:  Medicare Risk at Home Any stairs in or around the home?: No If so, are there any without handrails?: No Home free of loose throw rugs in walkways, pet beds, electrical cords, etc?: Yes Adequate lighting in your home to reduce risk of falls?: Yes Life alert?: No Use of a cane, walker or w/c?: No Grab bars in the bathroom?: Yes Shower chair or bench in shower?: No Elevated toilet seat or a handicapped toilet?: Yes  TIMED UP AND GO:  Was the test performed?  No  Cognitive Function: 6CIT completed        02/10/2024    9:39 AM 02/05/2023    4:00 PM 01/24/2022    4:08 PM  6CIT Screen  What Year? 0 points 0 points 0 points  What month? 0 points 0 points 0 points  What time? 0 points 0 points 0 points  Count back from 20 0 points 0 points 0 points  Months in reverse 0 points 0 points 0 points  Repeat phrase 0 points 0 points 0 points  Total Score 0 points 0 points 0 points    Immunizations Immunization History  Administered Date(s) Administered   Fluad Quad(high Dose 65+) 02/25/2019, 03/15/2020, 04/04/2021, 03/20/2022   INFLUENZA, HIGH DOSE SEASONAL PF 02/23/2013, 03/07/2015, 02/28/2016, 02/26/2017, 03/04/2018   Influenza Nasal 03/26/2023   Influenza,inj,Quad PF,6+ Mos 02/23/2013, 03/07/2015   Influenza-Unspecified 02/23/2013, 03/07/2015, 02/28/2016   PFIZER(Purple Top)SARS-COV-2 Vaccination 07/23/2019, 08/13/2019, 02/12/2020, 01/19/2021, 03/26/2023   Pneumococcal Conjugate-13 06/01/2009, 04/22/2014, 04/22/2014   Pneumococcal  Polysaccharide-23 06/01/2009, 06/01/2009   Td 02/26/2005   Td (Adult),5 Lf Tetanus Toxid, Preservative Free 02/26/2005   Tdap 02/26/2005, 12/30/2011, 03/20/2022   Zoster Recombinant(Shingrix) 08/29/2017, 12/31/2017   Zoster, Live 06/01/2009    Screening Tests Health Maintenance  Topic Date Due   COVID-19 Vaccine (6 - 2024-25 season) 05/21/2023   INFLUENZA VACCINE  01/16/2024   FOOT EXAM  05/20/2024   HEMOGLOBIN A1C  05/20/2024   Diabetic kidney evaluation - Urine ACR  11/18/2024   Diabetic kidney evaluation - eGFR measurement  12/22/2024   OPHTHALMOLOGY EXAM  01/06/2025   Medicare Annual Wellness (AWV)  02/09/2025   DTaP/Tdap/Td (6 - Td or Tdap) 03/20/2032   Pneumococcal Vaccine: 50+ Years  Completed   Zoster Vaccines- Shingrix  Completed   HPV VACCINES  Aged Out   Meningococcal B Vaccine  Aged Out   Colonoscopy  Discontinued   Hepatitis C Screening  Discontinued    Health Maintenance  Health Maintenance Due  Topic Date Due   COVID-19  Vaccine (6 - 2024-25 season) 05/21/2023   INFLUENZA VACCINE  01/16/2024    Additional Screening:  Vision Screening: Recommended annual ophthalmology exams for early detection of glaucoma and other disorders of the eye. Would you like a referral to an eye doctor? No    Dental Screening: Recommended annual dental exams for proper oral hygiene  Community Resource Referral / Chronic Care Management: CRR required this visit?  No   CCM required this visit?  No   Plan:    I have personally reviewed and noted the following in the patient's chart:   Medical and social history Use of alcohol, tobacco or illicit drugs  Current medications and supplements including opioid prescriptions. Patient is not currently taking opioid prescriptions. Functional ability and status Nutritional status Physical activity Advanced directives List of other physicians Hospitalizations, surgeries, and ER visits in previous 12 months Vitals Screenings to  include cognitive, depression, and falls Referrals and appointments  In addition, I have reviewed and discussed with patient certain preventive protocols, quality metrics, and best practice recommendations. A written personalized care plan for preventive services as well as general preventive health recommendations were provided to patient.   Lavelle Pfeiffer [place], [place]   [id number]   After Visit Summary: (MyChart) Due to this being a telephonic visit, the after visit summary with patients personalized plan was offered to patient via MyChart   Notes: Nothing significant to report at this time.

## 2024-02-15 ENCOUNTER — Other Ambulatory Visit: Payer: Self-pay | Admitting: Family Medicine

## 2024-02-18 ENCOUNTER — Encounter: Payer: Self-pay | Admitting: Pharmacist

## 2024-02-18 NOTE — Progress Notes (Addendum)
 Pharmacy Quality Measure Review  This patient is appearing on a report for being at risk of failing the adherence measure for hypertension (ACEi/ARB) medications this calendar year.   Medication: losartan -HCT Last fill date: 11/21/2023 for 90 day supply  Reviewed recent refill history in Dr Annemarie database. Actual last refill date was 02/18/2024 for 90 day supply. Patient has not picked up. Patient has 1 refill remaining. Next appointment with PCP is 05/2024.    Called patient to let him know Rx was updated for losartan -HCT by Dr Frann. He plans to pick up tomorrow 9/4  Madelin Ray, PharmD Clinical Pharmacist [facility]   02/24/2024 - Addendum Patient is showing on September adherence report for [facility] - losartan  HCT. It looks like Rx was filled for 90 day supply 02/18/2024. I called Arloa Prior today and verified patient did pick up Rx 02/19/2024. No further intervention needed at this time.   Madelin Ray, PharmD Clinical Pharmacist [facility]  Population Health [phone]

## 2024-03-16 ENCOUNTER — Ambulatory Visit (INDEPENDENT_AMBULATORY_CARE_PROVIDER_SITE_OTHER)

## 2024-03-16 ENCOUNTER — Other Ambulatory Visit (HOSPITAL_BASED_OUTPATIENT_CLINIC_OR_DEPARTMENT_OTHER): Payer: Self-pay

## 2024-03-16 DIAGNOSIS — Z23 Encounter for immunization: Secondary | ICD-10-CM

## 2024-03-16 MED ORDER — COMIRNATY 30 MCG/0.3ML IM SUSY
0.3000 mL | PREFILLED_SYRINGE | Freq: Once | INTRAMUSCULAR | 0 refills | Status: AC
  Filled 2024-03-16: qty 0.3, 1d supply, fill #0

## 2024-03-21 ENCOUNTER — Other Ambulatory Visit: Payer: Self-pay | Admitting: Family Medicine

## 2024-03-21 DIAGNOSIS — R7301 Impaired fasting glucose: Secondary | ICD-10-CM

## 2024-05-19 ENCOUNTER — Other Ambulatory Visit: Payer: Self-pay

## 2024-05-19 ENCOUNTER — Ambulatory Visit: Admitting: Family Medicine

## 2024-05-19 ENCOUNTER — Ambulatory Visit: Payer: Self-pay | Admitting: Family Medicine

## 2024-05-19 ENCOUNTER — Encounter: Payer: Self-pay | Admitting: Family Medicine

## 2024-05-19 VITALS — BP 130/78 | HR 73 | Temp 98.0°F | Resp 16 | Ht 68.0 in | Wt 211.0 lb

## 2024-05-19 DIAGNOSIS — I8393 Asymptomatic varicose veins of bilateral lower extremities: Secondary | ICD-10-CM | POA: Insufficient documentation

## 2024-05-19 DIAGNOSIS — I10 Essential (primary) hypertension: Secondary | ICD-10-CM | POA: Diagnosis not present

## 2024-05-19 DIAGNOSIS — Z7984 Long term (current) use of oral hypoglycemic drugs: Secondary | ICD-10-CM | POA: Diagnosis not present

## 2024-05-19 DIAGNOSIS — E7800 Pure hypercholesterolemia, unspecified: Secondary | ICD-10-CM | POA: Diagnosis not present

## 2024-05-19 DIAGNOSIS — E039 Hypothyroidism, unspecified: Secondary | ICD-10-CM

## 2024-05-19 DIAGNOSIS — E1165 Type 2 diabetes mellitus with hyperglycemia: Secondary | ICD-10-CM | POA: Diagnosis not present

## 2024-05-19 LAB — COMPREHENSIVE METABOLIC PANEL WITH GFR
ALT: 22 U/L (ref 0–53)
AST: 24 U/L (ref 0–37)
Albumin: 4.7 g/dL (ref 3.5–5.2)
Alkaline Phosphatase: 63 U/L (ref 39–117)
BUN: 13 mg/dL (ref 6–23)
CO2: 33 meq/L — ABNORMAL HIGH (ref 19–32)
Calcium: 9.9 mg/dL (ref 8.4–10.5)
Chloride: 96 meq/L (ref 96–112)
Creatinine, Ser: 1.06 mg/dL (ref 0.40–1.50)
GFR: 65.98 mL/min
Glucose, Bld: 96 mg/dL (ref 70–99)
Potassium: 3.6 meq/L (ref 3.5–5.1)
Sodium: 140 meq/L (ref 135–145)
Total Bilirubin: 1 mg/dL (ref 0.2–1.2)
Total Protein: 7.5 g/dL (ref 6.0–8.3)

## 2024-05-19 LAB — LIPID PANEL
Cholesterol: 153 mg/dL (ref 0–200)
HDL: 48 mg/dL
LDL Cholesterol: 75 mg/dL (ref 0–99)
NonHDL: 105.24
Total CHOL/HDL Ratio: 3
Triglycerides: 153 mg/dL — ABNORMAL HIGH (ref 0.0–149.0)
VLDL: 30.6 mg/dL (ref 0.0–40.0)

## 2024-05-19 LAB — HEMOGLOBIN A1C: Hgb A1c MFr Bld: 6.3 % (ref 4.6–6.5)

## 2024-05-19 LAB — T4, FREE: Free T4: 0.93 ng/dL (ref 0.60–1.60)

## 2024-05-19 LAB — TSH: TSH: 3.2 u[IU]/mL (ref 0.35–5.50)

## 2024-05-19 NOTE — Progress Notes (Signed)
 Subjective:   Chief Complaint  Patient presents with   Diabetes    Diabetes     Brett Armstrong is a 83 y.o. male here for follow-up of diabetes.   Brett Armstrong does not routinely ck his sugars.  Patient does not require insulin .   Medications include: Metformin  XR 1000 mg daily Diet is healthy.  Exercise: Walking  Hypertension Patient presents for hypertension follow up. He does not monitor home blood pressures. He is compliant with medications-Hyzaar 100-25 mg daily, Coreg  12.5 mg twice daily. Patient has these side effects of medication: none Diet/exercise as above. No chest pain or shortness of breath.  Hyperlipidemia Patient presents for hyperlipidemia follow up. Currently being treated with pravastatin  40 mg daily and compliance with treatment thus far has been good. He denies myalgias. Diet/exercise as above The patient is not known to have coexisting coronary artery disease.  Hypothyroidism Patient presents for follow-up of hypothyroidism.  Reports compliance with medication-levothyroxine  88 mcg daily. Current symptoms include: denies fatigue, weight changes, heat/cold intolerance, bowel/skin changes or CVS symptoms He believes his dose should be not significantly changed  Past Medical History:  Diagnosis Date   Arthritis    KNEES, HANDS    Chronic kidney disease    HX OF KIDNEY STONES    Diabetes mellitus (HCC)    ED (erectile dysfunction)    GERD (gastroesophageal reflux disease)    History of DVT (deep vein thrombosis) 2016   Provoked by surgery; moved to pulm   Hyperlipidemia    Hypertension    Hypothyroid    Nephrolithiasis    OSA (obstructive sleep apnea)    Peripheral vascular disease    left leg circulaton problem per pt    Rosacea    Vitamin B 12 deficiency      Related testing: Retinal exam: Done Pneumovax: done  Objective:  BP 130/78 (BP Location: Left Arm, Patient Position: Sitting)   Pulse 73   Temp 98 F (36.7 C) (Oral)   Resp 16    Ht 5' 8 (1.727 m)   Wt 211 lb (95.7 kg)   SpO2 98%   BMI 32.08 kg/m  General:  Well developed, well nourished, in no apparent distress Skin: Varicosities noted over bilateral lower extremities Mouth: MMD, no ulcerations/lesions noted Lungs:  CTAB, no access msc use Cardio:  RRR, no bruits, no LE edema Psych: Age appropriate judgment and insight  Assessment:   Type 2 diabetes mellitus with hyperglycemia, without long-term current use of insulin  (HCC) - Plan: Comprehensive metabolic panel with GFR, Lipid panel, Hemoglobin A1c  Essential hypertension, benign  Pure hypercholesterolemia  Hypothyroidism, unspecified type - Plan: TSH, T4, free  Varicose veins of both lower extremities, unspecified whether complicated   Plan:   Chronic, stable.  Continue metformin  XR 1000 mg daily.  Counseled on diet and exercise. Chronic, stable.  Continue Hyzaar 100-25 mg daily, Coreg  12.5 mg twice daily. Chronic, stable.  Continue pravastatin  40 mg daily. Chronic, stable.  Continue levothyroxine  88 mcg daily. Will continue to monitor.  He has been using compression stockings.  He does not wish to see a specialist at this time. F/u in 6 mo. The patient voiced understanding and agreement to the plan.  Mabel Mt [place], DO 05/19/24 8:24 AM

## 2024-05-19 NOTE — Patient Instructions (Signed)
 Give us  2-3 business days to get the results of your labs back.   Keep the diet clean and stay active.  Let us  know if you need anything.

## 2024-05-27 ENCOUNTER — Telehealth: Payer: Self-pay | Admitting: Pharmacist

## 2024-05-27 ENCOUNTER — Other Ambulatory Visit: Payer: Self-pay | Admitting: Pharmacist

## 2024-05-27 MED ORDER — LOSARTAN POTASSIUM-HCTZ 100-25 MG PO TABS: 1.0000 | ORAL_TABLET | Freq: Every morning | ORAL | 0 refills | Status: DC

## 2024-05-27 MED ORDER — LOSARTAN POTASSIUM-HCTZ 100-25 MG PO TABS: 1.0000 | ORAL_TABLET | Freq: Every morning | ORAL | 0 refills | Status: AC

## 2024-05-27 NOTE — Progress Notes (Signed)
 Pharmacy Quality Measure Review  This patient is appearing on a report for being at risk of failing the adherence measure for hypertension (ACEi/ARB) medications this calendar year.   Medication: losartan -HCT Last fill date: 02/18/2024 for 90 day supply per adherence report.   Reviewed recent refill history in [facility] and actual last refill date was 05/21/2024 for 90 day supply. He has not refills remaining. He was seen by PCP 05/19/2024 and next appointment will be 11/2024  Send in updated prescription to profile to last until he sees PCP again.   Madelin Ray, PharmD Clinical Pharmacist [place] [facility]

## 2024-06-03 ENCOUNTER — Other Ambulatory Visit: Payer: Self-pay | Admitting: Family Medicine

## 2024-06-03 DIAGNOSIS — K219 Gastro-esophageal reflux disease without esophagitis: Secondary | ICD-10-CM

## 2024-06-19 ENCOUNTER — Other Ambulatory Visit: Payer: Self-pay | Admitting: Family Medicine

## 2024-06-19 DIAGNOSIS — E785 Hyperlipidemia, unspecified: Secondary | ICD-10-CM

## 2024-06-29 ENCOUNTER — Ambulatory Visit: Payer: Self-pay | Admitting: Family Medicine

## 2024-06-29 ENCOUNTER — Other Ambulatory Visit

## 2024-06-29 DIAGNOSIS — E1165 Type 2 diabetes mellitus with hyperglycemia: Secondary | ICD-10-CM | POA: Diagnosis not present

## 2024-06-29 LAB — LIPID PANEL
Cholesterol: 139 mg/dL (ref 28–200)
HDL: 43.9 mg/dL
LDL Cholesterol: 61 mg/dL (ref 10–99)
NonHDL: 95.41
Total CHOL/HDL Ratio: 3
Triglycerides: 171 mg/dL — ABNORMAL HIGH (ref 10.0–149.0)
VLDL: 34.2 mg/dL (ref 0.0–40.0)

## 2024-09-02 ENCOUNTER — Ambulatory Visit (HOSPITAL_BASED_OUTPATIENT_CLINIC_OR_DEPARTMENT_OTHER): Admitting: Pulmonary Disease

## 2024-11-24 ENCOUNTER — Encounter: Admitting: Family Medicine

## 2025-02-22 ENCOUNTER — Ambulatory Visit
